# Patient Record
Sex: Female | Born: 1975
Health system: Southern US, Community
[De-identification: ages and names within clinical notes are randomized; demographics above are authoritative.]

## PROBLEM LIST (undated history)

## (undated) DIAGNOSIS — D259 Leiomyoma of uterus, unspecified: Secondary | ICD-10-CM

## (undated) DIAGNOSIS — T6111XA Scombroid fish poisoning, accidental (unintentional), initial encounter: Secondary | ICD-10-CM

## (undated) DIAGNOSIS — Z91030 Bee allergy status: Secondary | ICD-10-CM

## (undated) DIAGNOSIS — R011 Cardiac murmur, unspecified: Secondary | ICD-10-CM

## (undated) DIAGNOSIS — O139 Gestational [pregnancy-induced] hypertension without significant proteinuria, unspecified trimester: Secondary | ICD-10-CM

## (undated) DIAGNOSIS — H547 Unspecified visual loss: Secondary | ICD-10-CM

## (undated) HISTORY — DX: Gestational (pregnancy-induced) hypertension without significant proteinuria, unspecified trimester: O13.9

## (undated) HISTORY — DX: Bee allergy status: Z91.030

## (undated) HISTORY — PX: WISDOM TOOTH EXTRACTION: SHX21

## (undated) HISTORY — DX: Unspecified visual loss: H54.7

## (undated) HISTORY — DX: Leiomyoma of uterus, unspecified: D25.9

## (undated) HISTORY — DX: Scombroid fish poisoning, accidental (unintentional), initial encounter: T61.11XA

## (undated) HISTORY — DX: Cardiac murmur, unspecified: R01.1

---

## 2003-07-09 DIAGNOSIS — T6111XA Scombroid fish poisoning, accidental (unintentional), initial encounter: Secondary | ICD-10-CM

## 2003-07-09 HISTORY — DX: Scombroid fish poisoning, accidental (unintentional), initial encounter: T61.11XA

## 2003-09-08 DIAGNOSIS — R011 Cardiac murmur, unspecified: Secondary | ICD-10-CM

## 2003-09-08 HISTORY — DX: Cardiac murmur, unspecified: R01.1

## 2004-11-16 ENCOUNTER — Other Ambulatory Visit: Admission: RE | Admit: 2004-11-16 | Discharge: 2004-11-16 | Payer: Self-pay | Admitting: Obstetrics and Gynecology

## 2005-12-24 ENCOUNTER — Other Ambulatory Visit: Admission: RE | Admit: 2005-12-24 | Discharge: 2005-12-24 | Payer: Self-pay | Admitting: Obstetrics and Gynecology

## 2006-05-15 ENCOUNTER — Ambulatory Visit (HOSPITAL_COMMUNITY): Admission: RE | Admit: 2006-05-15 | Discharge: 2006-05-15 | Payer: Self-pay | Admitting: Obstetrics and Gynecology

## 2006-11-18 ENCOUNTER — Inpatient Hospital Stay (HOSPITAL_COMMUNITY): Admission: AD | Admit: 2006-11-18 | Discharge: 2006-11-18 | Payer: Self-pay | Admitting: Obstetrics and Gynecology

## 2007-04-28 ENCOUNTER — Ambulatory Visit (HOSPITAL_COMMUNITY): Admission: RE | Admit: 2007-04-28 | Discharge: 2007-04-28 | Payer: Self-pay | Admitting: Obstetrics and Gynecology

## 2007-06-30 ENCOUNTER — Inpatient Hospital Stay (HOSPITAL_COMMUNITY): Admission: AD | Admit: 2007-06-30 | Discharge: 2007-06-30 | Payer: Self-pay | Admitting: Obstetrics and Gynecology

## 2007-07-03 ENCOUNTER — Inpatient Hospital Stay (HOSPITAL_COMMUNITY): Admission: AD | Admit: 2007-07-03 | Discharge: 2007-07-03 | Payer: Self-pay | Admitting: Obstetrics & Gynecology

## 2007-07-11 ENCOUNTER — Inpatient Hospital Stay (HOSPITAL_COMMUNITY): Admission: RE | Admit: 2007-07-11 | Discharge: 2007-07-13 | Payer: Self-pay | Admitting: Obstetrics and Gynecology

## 2010-11-30 ENCOUNTER — Inpatient Hospital Stay (HOSPITAL_COMMUNITY): Admission: AD | Admit: 2010-11-30 | Payer: Self-pay | Admitting: Obstetrics and Gynecology

## 2010-12-04 ENCOUNTER — Inpatient Hospital Stay (HOSPITAL_COMMUNITY)
Admission: AD | Admit: 2010-12-04 | Discharge: 2010-12-07 | DRG: 373 | Disposition: A | Discharge status: Home / Self Care | Payer: BC Managed Care – PPO | Source: Ambulatory Visit | Attending: Obstetrics and Gynecology | Admitting: Obstetrics and Gynecology

## 2010-12-04 DIAGNOSIS — O480 Post-term pregnancy: Principal | ICD-10-CM | POA: Diagnosis present

## 2010-12-04 LAB — CBC
MCHC: 34.7 g/dL (ref 30.0–36.0)
Platelets: 237 10*3/uL (ref 150–400)
RDW: 14.2 % (ref 11.5–15.5)
WBC: 11.6 10*3/uL — ABNORMAL HIGH (ref 4.0–10.5)

## 2010-12-05 LAB — RPR: RPR Ser Ql: NONREACTIVE

## 2010-12-06 LAB — CBC
Platelets: 176 10*3/uL (ref 150–400)
RBC: 2.61 MIL/uL — ABNORMAL LOW (ref 3.87–5.11)
RDW: 14.7 % (ref 11.5–15.5)
WBC: 15.7 10*3/uL — ABNORMAL HIGH (ref 4.0–10.5)

## 2010-12-07 LAB — CBC
HCT: 21.8 % — ABNORMAL LOW (ref 36.0–46.0)
Hemoglobin: 7.2 g/dL — ABNORMAL LOW (ref 12.0–15.0)
MCHC: 33 g/dL (ref 30.0–36.0)
MCV: 92.4 fL (ref 78.0–100.0)
RDW: 15 % (ref 11.5–15.5)
WBC: 12.9 10*3/uL — ABNORMAL HIGH (ref 4.0–10.5)

## 2010-12-08 LAB — RH IMMUNE GLOB WKUP(>/=20WKS)(NOT WOMEN'S HOSP)

## 2011-02-20 NOTE — Op Note (Signed)
Laura Henderson, Laura Henderson              ACCOUNT NO.:  [id number]   MEDICAL RECORD NO.:  [id number]          PATIENT TYPE:  INP   LOCATION:  9131                          FACILITY:  [facility]   PHYSICIAN:  Juluis Mire, M.D.   DATE OF BIRTH:  24-Nov-1975   DATE OF PROCEDURE:  07/11/2007  DATE OF DISCHARGE:                               OPERATIVE REPORT   The patient is a 35 year old primigravida female brought in for  induction of labor due to [facility].  We did do artificial rupture of  membranes and Pitocin.  She advanced to complete dilatation.  After  approximately one and a half hours pushing, the vertex remained  arrested.  We found that it was OP and manually rotated it.  With this,  the fetal vertex descended further.  After another hour to hour and a  half of pushing, the fetal vertex was just beginning to crown, however,  due to maternal exhaustion, decision was to proceed with the vacuum  extractor assisted vaginal delivery.  The risks were explained including  subcutaneous hemorrhage that could require transfusion, intracranial  bleeds that could require operative intervention.  The patient was  placed in the dorsal lithotomy position.  The bladder was emptied by out  catheterization.  The fetal vertex was on the perineum.  The key was put  in place with the next contraction, we were able to bring the vertex out  to complete crowning.  The vacuum extractor was then removed.  At the  next contraction, she spontaneously delivered the vertex.  The infant  was a viable female, had Apgars of 7 and 9.  PH is pending.  There was  both nuchal and cord around the abdomen.  At this point in time,  placenta was delivered manually.  We then did identified a partial third  degree tear with just slightly tearing into the muscular capsule.  We  infiltrated the area with 1% Xylocaine.  Using 2-0 chromic, the capsule  was reapproximated with a figure-of-eight of chromic.  Next, remaining  second degree  episiotomy is repaired in usual manner with 2-0 chromic.  Rectal exam was unremarkable.  Uterus contracted down well.  Blood loss  was probably 500 mL.  The baby and mother were doing well.  The patient  did have an epidural during the labor.      Juluis Mire, M.D.  Electronically Signed     JSM/MEDQ  D:  07/11/2007  T:  07/13/2007  Job:  [id number]

## 2011-07-19 LAB — URINALYSIS, ROUTINE W REFLEX MICROSCOPIC
Glucose, UA: NEGATIVE
Leukocytes, UA: NEGATIVE
Leukocytes, UA: NEGATIVE
Nitrite: NEGATIVE
Protein, ur: NEGATIVE
Specific Gravity, Urine: <1.005 — ABNORMAL LOW
Urobilinogen, UA: 0.2
pH: 7

## 2011-07-19 LAB — COMPREHENSIVE METABOLIC PANEL
ALT: 14
AST: 17
Alkaline Phosphatase: 93
CO2: 24
CO2: 24
Calcium: 8.7
Calcium: 9.1
Chloride: 108
Creatinine, Ser: 0.62
GFR calc Af Amer: >60
GFR calc Af Amer: >60
GFR calc non Af Amer: >60
GFR calc non Af Amer: >60
Glucose, Bld: 105 — ABNORMAL HIGH
Glucose, Bld: 95
Potassium: 3.3 — ABNORMAL LOW
Sodium: 137
Total Protein: 5.9 — ABNORMAL LOW

## 2011-07-19 LAB — CBC
HCT: 26.4 — ABNORMAL LOW
Hemoglobin: 12
Hemoglobin: 11.6 — ABNORMAL LOW
MCHC: 35
MCHC: 35.3
MCHC: 36
MCV: 91.7
MCV: 92.6
MCV: 91.6
Platelets: 236
RBC: 3.73 — ABNORMAL LOW
RBC: 3.55 — ABNORMAL LOW
RBC: 3.64 — ABNORMAL LOW
RDW: 13
RDW: 13.4
RDW: 13
WBC: 21.7 — ABNORMAL HIGH
WBC: 12.4 — ABNORMAL HIGH

## 2011-07-19 LAB — URINE MICROSCOPIC-ADD ON

## 2011-07-19 LAB — LACTATE DEHYDROGENASE: LDH: 158

## 2011-07-19 LAB — RH IMMUNE GLOB WKUP(>/=20WKS)(NOT WOMEN'S HOSP)

## 2011-07-23 LAB — RH IMMUNE GLOBULIN WORKUP (NOT WOMEN'S HOSP)

## 2012-02-05 ENCOUNTER — Encounter: Payer: Self-pay | Admitting: Medical

## 2012-02-05 ENCOUNTER — Ambulatory Visit (INDEPENDENT_AMBULATORY_CARE_PROVIDER_SITE_OTHER): Payer: BC Managed Care – PPO | Admitting: Medical

## 2012-02-05 VITALS — BP 110/70 | HR 70 | Temp 98.4°F | Wt 146.0 lb

## 2012-02-05 DIAGNOSIS — J029 Acute pharyngitis, unspecified: Secondary | ICD-10-CM

## 2012-02-05 NOTE — Progress Notes (Signed)
Subjective:  Laura Henderson is a 36 y.o. female who presents as a new patient today.  Former patient of Dr. Delford Field at [facility].  She is here for 1 wk hx/o sore throat.  [facility], has ulcer in back of throat and ear infection along with sore throat, prescribed Amoxicillin due to sore throat, and she is on day 7 of this.  She notes ongoing sore throat pain, some ear pressure, post nasal drainage, mild cough.   Only sick contacts is her 36yo with runny nose.  Otherwise feels ok.  No strep contacts.  Using Tylenol.  No other aggravating or relieving factors.    Past Medical History  Diagnosis Date  . Vision problem     wears contacts and glasses   ROS Gen: no fever, chills HEENT: no sinus pressure, headache Lungs: no SOB, wheezing GI: no NVD, no hx/o GERD or heartburn    Objective:      Filed Vitals:   02/05/12 1535  BP: 110/70  Pulse: 70  Temp: 98.4 F (36.9 C)    General appearance: no distress, WD/WN HEENT: normocephalic, conjunctiva/corneas normal, sclerae anicteric, nares patent, no discharge or erythema, pharynx with mild to moderate erythema, +post nasal drip, no exudate.  Oral cavity: MMM, no lesions  Neck: supple, shoddy anterior lymphadenopathy, no thyromegaly Heart: RRR, normal S1, S2, no murmurs Lungs: CTA bilaterally, no wheezes, rhonchi, or rales  Laboratory Strep test done. Results:negative.    Assessment and Plan:   Encounter Diagnosis  Name Primary?  . Sore throat Yes    Advised that symptoms and exam suggest a viral etiology vs some post nasal drip likely due to pollen.  Discussed symptomatic treatment including salt water gargles, warm fluids, rest, hydrate well, can use over-the-counter Tylenol for throat pain, fever, or malaise.   Finish amoxicillin and consider Zyrtec 10mg  QHS OTC for a few weeks.  If worse or not improving within 2-3 days, call or return.

## 2012-07-30 ENCOUNTER — Encounter: Payer: Self-pay | Admitting: Internal Medicine

## 2012-10-23 ENCOUNTER — Encounter: Payer: Self-pay | Admitting: Family Medicine

## 2012-10-23 ENCOUNTER — Ambulatory Visit (INDEPENDENT_AMBULATORY_CARE_PROVIDER_SITE_OTHER): Payer: BC Managed Care – PPO | Admitting: Family Medicine

## 2012-10-23 VITALS — BP 118/70 | HR 68 | Ht 67.5 in | Wt 145.0 lb

## 2012-10-23 DIAGNOSIS — Z13220 Encounter for screening for lipoid disorders: Secondary | ICD-10-CM

## 2012-10-23 DIAGNOSIS — Z0000 Encounter for general adult medical examination without abnormal findings: Secondary | ICD-10-CM

## 2012-10-23 DIAGNOSIS — R5381 Other malaise: Secondary | ICD-10-CM

## 2012-10-23 LAB — POCT URINALYSIS DIPSTICK
Ketones, UA: NEGATIVE
Leukocytes, UA: NEGATIVE
Protein, UA: NEGATIVE
Spec Grav, UA: <=1.005
pH, UA: 6

## 2012-10-23 NOTE — Patient Instructions (Addendum)

## 2012-10-23 NOTE — Progress Notes (Signed)
Chief Complaint  Patient presents with  . Annual Exam    fasting annual exam no pap sees Dr Renaldo Fiddler at [facility] and is currently UTD. No major concerns.   Laura Henderson is a 37 y.o. female who presents for a complete physical.  She has no specific conerns.   Immunization History  Administered Date(s) Administered  . Influenza Split 07/29/2012  . Tdap 05/21/2005   Last Pap smear: 2013 Last mammogram: never Last colonoscopy: never Last DEXA: never Dentist: twice yearly Ophtho: yearly Exercise: 3x/week at gym; swimming, running, classes  Past Medical History  Diagnosis Date  . Vision problem     wears contacts and glasses  . 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 Bee sting allergy   . Heart murmur 12/04    echo mild TR, trace MR  . PIH (pregnancy induced hypertension)     end of first pregnancy    Past Surgical History  Procedure Date  . Wisdom tooth extraction     History   Social History  . Marital Status: Married    Spouse Name: N/A    Number of Children: N/A  . Years of Education: N/A   Occupational History  . homemaker    Social History Main Topics  . Smoking status: Never Smoker   . Smokeless tobacco: Never Used  . Alcohol Use: 0.6 oz/week    1 Cans of beer per week     Comment: 2-3 drinks per week, wine.  . Drug Use: No  . Sexually Active: Yes -- Female partner(s)    Birth Control/ Protection: IUD   Other Topics Concern  . Not on file   Social History Narrative   Married, 2 daughters, 2 dogs    Family History  Problem Relation Age of Onset  . Hypertension Mother   . Diabetes Father     pre-diabetes  . Muscular dystrophy Maternal Aunt   . Cancer Paternal Grandmother 58    breast  . Breast cancer Paternal Grandmother   . Diabetes Paternal Grandfather   . Heart disease Neg Hx   . Stroke Neg Hx   . Diabetes Other     Current outpatient prescriptions:levonorgestrel (MIRENA) 20 MCG/24HR IUD, 1 each by Intrauterine route once., Disp:  , Rfl: ;  Multiple Vitamins-Minerals (MULTIVITAMIN WITH MINERALS) tablet, Take 1 tablet by mouth daily., Disp: , Rfl:   Allergies  Allergen Reactions  . Sulfa Drugs Cross Reactors Hives   ROS:  The patient denies anorexia, fever, weight changes, headaches,  vision changes, decreased hearing, ear pain, sore throat, breast concerns, chest pain, palpitations, dizziness, syncope, dyspnea on exertion, cough, swelling, nausea, vomiting, diarrhea, constipation, abdominal pain, melena, hematochezia, indigestion/heartburn, hematuria, incontinence, dysuria, no regular menses, just occasional spotting (IUD), vaginal discharge, odor or itch, genital lesions, joint pains, numbness, tingling, weakness, tremor, suspicious skin lesions (sees dermatologist yearly), depression, anxiety, abnormal bleeding/bruising, or enlarged lymph nodes. Donates blood regularly and Hg is fine.  PHYSICAL EXAM: BP 118/70  Pulse 68  Ht 5' 7.5" (1.715 m)  Wt 145 lb (65.772 kg)  BMI 22.38 kg/m2  General Appearance:    Alert, cooperative, no distress, appears stated age  Head:    Normocephalic, without obvious abnormality, atraumatic  Eyes:    PERRL, conjunctiva/corneas clear, EOM's intact, fundi    benign  Ears:    Normal TM's and external ear canals  Nose:   Nares normal, mucosa normal, no drainage or sinus   tenderness  Throat:   Lips, mucosa, and  tongue normal; teeth and gums normal  Neck:   Supple, no lymphadenopathy;  thyroid:  no   enlargement/tenderness/nodules; no carotid   bruit or JVD  Back:    Spine nontender, no curvature, ROM normal, no CVA     tenderness  Lungs:     Clear to auscultation bilaterally without wheezes, rales or     ronchi; respirations unlabored  Chest Wall:    No tenderness or deformity   Heart:    Regular rate and rhythm, S1 and S2 normal, no murmur, rub   or gallop  Breast Exam:    Deferred to GYN  Abdomen:     Soft, non-tender, nondistended, normoactive bowel sounds,    no masses, no  hepatosplenomegaly  Genitalia:    Deferred to GYN     Extremities:   No clubbing, cyanosis or edema  Pulses:   2+ and symmetric all extremities  Skin:   Skin color, texture, turgor normal, no rashes or lesions  Lymph nodes:   Cervical, supraclavicular, and axillary nodes normal  Neurologic:   CNII-XII intact, normal strength, sensation and gait; reflexes 2+ and symmetric throughout          Psych:   Normal mood, affect, hygiene and grooming.    ASSESSMENT/PLAN:  1. Routine general medical examination at a health care facility  Visual acuity screening, POCT Urinalysis Dipstick, Lipid panel, Comprehensive metabolic panel, Vitamin D 25 hydroxy, TSH  2. Screening for lipoid disorders  Lipid panel  3. Other malaise and fatigue  Comprehensive metabolic panel, Vitamin D 25 hydroxy, TSH    Discussed monthly self breast exams and yearly mammograms after the age of 33; at least 30 minutes of aerobic activity at least 5 days/week; proper sunscreen use reviewed; healthy diet, including goals of calcium and vitamin D intake and alcohol recommendations (less than or equal to 1 drink/day) reviewed; regular seatbelt use; changing batteries in smoke detectors.  Immunization recommendations discussed.  Colonoscopy recommendations reviewed

## 2012-10-24 ENCOUNTER — Encounter: Payer: Self-pay | Admitting: Family Medicine

## 2012-10-24 LAB — COMPREHENSIVE METABOLIC PANEL
AST: 12 U/L (ref 0–37)
Albumin: 4.7 g/dL (ref 3.5–5.2)
Alkaline Phosphatase: 43 U/L (ref 39–117)
Potassium: 4 mEq/L (ref 3.5–5.3)
Sodium: 138 mEq/L (ref 135–145)
Total Protein: 6.9 g/dL (ref 6.0–8.3)

## 2012-10-24 LAB — LIPID PANEL
Total CHOL/HDL Ratio: 2.4 Ratio
VLDL: 8 mg/dL (ref 0–40)

## 2013-01-21 ENCOUNTER — Other Ambulatory Visit: Payer: BC Managed Care – PPO

## 2013-01-21 DIAGNOSIS — R799 Abnormal finding of blood chemistry, unspecified: Secondary | ICD-10-CM

## 2013-01-21 LAB — HEPATIC FUNCTION PANEL
ALT: 10 U/L (ref 0–35)
Alkaline Phosphatase: 44 U/L (ref 39–117)
Indirect Bilirubin: 1.5 mg/dL — ABNORMAL HIGH (ref 0.0–0.9)
Total Protein: 6.9 g/dL (ref 6.0–8.3)

## 2013-01-22 ENCOUNTER — Encounter: Payer: Self-pay | Admitting: Family Medicine

## 2013-04-13 ENCOUNTER — Telehealth: Payer: Self-pay | Admitting: Internal Medicine

## 2013-04-13 MED ORDER — EPINEPHRINE 0.3 MG/0.3ML IJ SOAJ: 0.3000 mg | Freq: Once | INTRAMUSCULAR | Status: DC

## 2013-04-13 NOTE — Telephone Encounter (Signed)
Patient advised of Dr.Knapp's recommendations and epi pen was called in.

## 2013-04-13 NOTE — Telephone Encounter (Signed)
Advise pt--Epi-pens are only to be used for anaphylactic reactions (NOT tick bites that get infected, etc).  And always require an ER visit if used (due to possible side effects of the med, and recurrence of allergic reaction, requiring additional treatment).  Okay for epi-pen

## 2013-04-13 NOTE — Telephone Encounter (Signed)
Pt states she would like to carry an epipen with her if you will prescribe her one, cause she is qllergic to spider bites,and tick bites and then in the past has been allergic to bee stings and want to carry it for protection. Send to [facility] [address]

## 2013-07-27 ENCOUNTER — Ambulatory Visit (INDEPENDENT_AMBULATORY_CARE_PROVIDER_SITE_OTHER): Payer: Managed Care, Other (non HMO) | Admitting: Family Medicine

## 2013-07-27 ENCOUNTER — Encounter: Payer: Self-pay | Admitting: Family Medicine

## 2013-07-27 VITALS — BP 130/80 | HR 76 | Temp 98.7°F | Ht 68.0 in | Wt 140.0 lb

## 2013-07-27 DIAGNOSIS — Z23 Encounter for immunization: Secondary | ICD-10-CM

## 2013-07-27 DIAGNOSIS — J069 Acute upper respiratory infection, unspecified: Secondary | ICD-10-CM

## 2013-07-27 NOTE — Progress Notes (Signed)
Chief Complaint  Patient presents with  . cold    cold since last week. one eye last night was red and crusty., constant cough,, running nose with yellowish mucous, drainage, flu shot given today   She was sick a month ago, it got better for a week or two, and then it recurred.  One of her daughter's was sick about 1-2 weeks ago.  She got better, but her symptoms have progressively gotten worse.  She is having a lot of postnasal drainage and cough.  She initially had sore throat, starting 5 ays ago, which has improved, but cough has gotten worse.  Phlegm is yellow and thick.  Similar mucus from the nose.  She is having sinus pressure under her eyes.  Yesterday morning she woke up with crusting of her nose and eyes, and last night she noticed that her right eye was red.  She has mild itching.  Eye is less red today. She is having some mild vertigo after leaning forward to pick up toys. No vertigo with other positions.  She has been using tylenol cold and sinus during the day, with some improvement.  She used a combination theraflu last night with decongestant cough suppressant--worked well until it wore off in the middle of the night.,  Past Medical History  Diagnosis Date  . Vision problem     wears contacts and glasses  . 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 Bee sting allergy   . Heart murmur 12/04    echo mild TR, trace MR  . PIH (pregnancy induced hypertension)     end of first pregnancy   Past Surgical History  Procedure Laterality Date  . Wisdom tooth extraction     History   Social History  . Marital Status: Married    Spouse Name: N/A    Number of Children: N/A  . Years of Education: N/A   Occupational History  . homemaker    Social History Main Topics  . Smoking status: Never Smoker   . Smokeless tobacco: Never Used  . Alcohol Use: 0.6 oz/week    1 Cans of beer per week     Comment: 2-3 drinks per week, wine.  . Drug Use: No  . Sexual Activity: Yes    Partners: Male    Birth Control/ Protection: IUD   Other Topics Concern  . Not on file   Social History Narrative   Married, 2 daughters, 2 dogs   Current outpatient prescriptions:EPINEPHrine (EPI-PEN) 0.3 mg/0.3 mL SOAJ, Inject 0.3 mLs (0.3 mg total) into the muscle once., Disp: 1 Device, Rfl: 0;  levonorgestrel (MIRENA) 20 MCG/24HR IUD, 1 each by Intrauterine route once., Disp: , Rfl: ;  Multiple Vitamins-Minerals (MULTIVITAMIN WITH MINERALS) tablet, Take 1 tablet by mouth daily., Disp: , Rfl: ;  VITAMIN D, ERGOCALCIFEROL, PO, Take by mouth., Disp: , Rfl:   Allergies  Allergen Reactions  . Sulfa Drugs Cross Reactors Hives   ROS: Denies fevers, nausea, vomiting, diarrhea, skin rashes, ear pain.  No bleeding/bruising. See HPI.  No chest pain, palpitations, edema or other concerns.  PHYSICAL EXAM: BP 130/80  Pulse 76  Temp(Src) 98.7 F (37.1 C) (Oral)  Ht 5\' 8"  (1.727 m)  Wt 140 lb (63.504 kg)  BMI 21.29 kg/m2 Well developed, pleasant female in no distress HEENT:  PERRL, EOMI.  Mild conjunctival injection of right eye. No crusting or purulence, no soft tissue swelling Nose--mild edema, mild erythema. Sinuses nontender. OP clear without erythema Neck: no lymphadenopathy or mass  Heart: regular rate and rhythm without murmur Lungs: clear bilaterally Skin: no rash Neuro: alert and oriented, cranial nerves intact  ASSESSMENT/PLAN:  Acute upper respiratory infections of unspecified site  Need for prophylactic vaccination and inoculation against influenza - Plan: Flu Vaccine QUAD 36+ mos IM  Supportive measures reviewed. Call later this week for antibiotic if symptoms persist/worsen rather than continue to improve (as would be the case if viral).

## 2013-07-27 NOTE — Patient Instructions (Signed)
Drink plenty of fluids. Continue decongestants (ie I recommend a 12-hour sudafed to be taken in the morning) Continue expectorant (I recommend Mucinex--either plain, along with Delsym syrup as needed for cough, or you get get the combination  Mucinex DM which also contains the cough suppressant).  Call us if your symptoms persist and/or worsen over the next week for antibiotics for a sinus infection (it appears yours is viral now). No evidence of bacterial conjunctivitis--your eye symptoms are likely viral also.  Avoid contact use.  Use eye drops as needed. Consider antihistamine eye drops if you have a lot of itching  Frequent hand washing

## 2013-08-03 ENCOUNTER — Telehealth: Payer: Self-pay | Admitting: Family Medicine

## 2013-08-03 DIAGNOSIS — J069 Acute upper respiratory infection, unspecified: Secondary | ICD-10-CM

## 2013-08-03 MED ORDER — AZITHROMYCIN 250 MG PO TABS: ORAL_TABLET | ORAL | Status: DC

## 2013-08-03 NOTE — Telephone Encounter (Signed)
Advise pt--zpak was sent to her pharmacy

## 2013-08-03 NOTE — Telephone Encounter (Signed)
Called to advise pt that Z pak sent to pharmacy

## 2013-08-07 DIAGNOSIS — Z0279 Encounter for issue of other medical certificate: Secondary | ICD-10-CM

## 2013-08-28 ENCOUNTER — Telehealth: Payer: Self-pay | Admitting: Internal Medicine

## 2013-08-28 NOTE — Telephone Encounter (Signed)
Faxed over medical records to [facility] @ [phone] on 08/07/13

## 2016-01-17 DIAGNOSIS — H61001 Unspecified perichondritis of right external ear: Secondary | ICD-10-CM | POA: Diagnosis not present

## 2016-01-17 DIAGNOSIS — L570 Actinic keratosis: Secondary | ICD-10-CM | POA: Diagnosis not present

## 2016-02-20 DIAGNOSIS — Z30433 Encounter for removal and reinsertion of intrauterine contraceptive device: Secondary | ICD-10-CM | POA: Diagnosis not present

## 2016-02-20 DIAGNOSIS — Z1231 Encounter for screening mammogram for malignant neoplasm of breast: Secondary | ICD-10-CM | POA: Diagnosis not present

## 2016-05-07 ENCOUNTER — Encounter (HOSPITAL_COMMUNITY): Payer: Self-pay | Admitting: Emergency Medicine

## 2016-05-07 ENCOUNTER — Emergency Department (HOSPITAL_COMMUNITY)
Admission: EM | Admit: 2016-05-07 | Discharge: 2016-05-07 | Disposition: A | Discharge status: Home / Self Care | Payer: Self-pay | Attending: Emergency Medicine | Admitting: Emergency Medicine

## 2016-05-07 DIAGNOSIS — T7840XA Allergy, unspecified, initial encounter: Secondary | ICD-10-CM | POA: Insufficient documentation

## 2016-05-07 MED ORDER — PREDNISONE 20 MG PO TABS: 40.0000 mg | ORAL_TABLET | Freq: Every day | ORAL | 0 refills | Status: DC

## 2016-05-07 MED ORDER — EPINEPHRINE 0.3 MG/0.3ML IJ SOAJ: 0.3000 mg | Freq: Once | INTRAMUSCULAR | 2 refills | Status: AC

## 2016-05-07 MED ORDER — PREDNISONE 20 MG PO TABS
60.0000 mg | ORAL_TABLET | ORAL | Status: AC
  Administered 2016-05-07: 60 mg via ORAL
  Filled 2016-05-07: qty 3

## 2016-05-07 NOTE — Discharge Instructions (Signed)
For the next 2 days please use Benadryl, 25 mg, 3 times daily in addition to Pepcid, 20 mg, twice daily.  Please monitor your condition carefully, return here for concerning changes in your condition.

## 2016-05-07 NOTE — ED Notes (Signed)
Pt agreeable to discharge, esignature not working at this time. Pt received D/C instructions and prescriptions.

## 2016-05-07 NOTE — ED Provider Notes (Signed)
[facility]-EMERGENCY DEPT Provider Note   CSN: [id number] Arrival date & time: 05/07/16  1224   History   Chief Complaint Chief Complaint  Patient presents with  . Allergic Reaction    HPI Laura Henderson is a 40 y.o. female.  HPI  Patient presents with concern of persistent swelling, discomfort, redness about an area on her left medial distal calf. Patient goals being stung 2 days ago. Since that time she has had persistent discomfort in the area, has been taking Benadryl, several times daily. However, the patient today had a change in condition, with sudden onset of generalized discomfort, lightheadedness, lip swelling. No additional precipitant. 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 Currently the patient states that she feels slightly better, has persistent pain in the distal leg, but no dyspnea, no throat swelling, no lightheadedness, no chest pain. Patient acknowledges a history of allergy to bee sting.   Past Medical History:  Diagnosis Date  . Bee sting allergy   . Heart murmur 12/04   echo mild TR, trace MR  . PIH (pregnancy induced hypertension)    end of first pregnancy  . 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 Vision problem    wears contacts and glasses    There are no active problems to display for this patient.   Past Surgical History:  Procedure Laterality Date  . WISDOM TOOTH EXTRACTION      OB History    No data available       Home Medications    Prior to Admission medications   Medication Sig Start Date End Date Taking? Authorizing Provider  EPINEPHrine 0.3 mg/0.3 mL IJ SOAJ injection Inject 0.3 mLs (0.3 mg total) into the muscle once. 05/07/16 05/07/16  Gerhard Munch, MD  levonorgestrel (MIRENA) 20 MCG/24HR IUD 1 each by Intrauterine route once. 02/06/11   Historical Provider, MD  Multiple Vitamins-Minerals (MULTIVITAMIN WITH MINERALS)  tablet Take 1 tablet by mouth daily.    Historical Provider, MD  predniSONE (DELTASONE) 20 MG tablet Take 2 tablets (40 mg total) by mouth daily with breakfast. For the next four days 05/07/16   Gerhard Munch, MD  VITAMIN D, ERGOCALCIFEROL, PO Take by mouth.    Historical Provider, MD    Family History Family History  Problem Relation Age of Onset  . Hypertension Mother   . Diabetes Father     pre-diabetes  . Diabetes Other   . Muscular dystrophy Maternal Aunt   . Cancer Paternal Grandmother 33    breast  . Breast cancer Paternal Grandmother   . Diabetes Paternal Grandfather   . Heart disease Neg Hx   . Stroke Neg Hx     Social History Social History  Substance Use Topics  . Smoking status: Never Smoker  . Smokeless tobacco: Never Used  . Alcohol use 0.6 oz/week    1 Cans of beer per week     Comment: 2-3 drinks per week, wine.     Allergies   Bee venom and Sulfa drugs cross reactors   Review of Systems Review of Systems  Constitutional: Negative for fever.  Respiratory: Negative for shortness of breath.   Cardiovascular: Negative for chest pain.  Musculoskeletal:       Negative aside from HPI  Skin: Positive for color change and wound.       Negative aside from HPI  Allergic/Immunologic: Negative for immunocompromised state.  Neurological: Negative for weakness.  Physical Exam Updated Vital Signs BP 133/75 (BP Location: Left Arm)   Pulse 93   Temp 97.7 F (36.5 C) (Oral)   Resp 16   Ht 5\' 8"  (1.727 m)   Wt 150 lb (68 kg)   SpO2 100%   BMI 22.81 kg/m   Physical Exam  Constitutional: She is oriented to person, place, and time. She appears well-developed and well-nourished. No distress.  HENT:  Head: Normocephalic and atraumatic.  Eyes: Conjunctivae and EOM are normal.  Pulmonary/Chest: Effort normal. No stridor. No respiratory distress.  Abdominal: She exhibits no distension.  Musculoskeletal: She exhibits no edema.  Neurological: She is alert  and oriented to person, place, and time. No cranial nerve deficit.  Skin: Skin is warm and dry.     Psychiatric: She has a normal mood and affect.  Nursing note and vitals reviewed.    Procedures Procedures (including critical care time)  Medications Ordered in ED Medications  predniSONE (DELTASONE) tablet 60 mg (not administered)     Initial Impression / Assessment and Plan / ED Course  I have reviewed the triage vital signs and the nursing notes.  Pertinent labs & imaging results that were available during my care of the patient were reviewed by me and considered in my medical decision making (see chart for details).  Clinical Course   Patient presents after suffering likely bee sting 36 hours ago.  Patient has evidence for local inflammatory reaction, but no evidence for anaphylaxis. Patient has already begun taking antihistamine, received steroids, additional instructions on dual antihistamine therapy, close return precautions. Patient also received a prescription for EpiPen. With no evidence for anaphylaxis, or ongoing cellulitis, patient discharged in stable condition.   Final Clinical Impressions(s) / ED Diagnoses   Final diagnoses:  Allergic reaction, initial encounter    New Prescriptions New Prescriptions   PREDNISONE (DELTASONE) 20 MG TABLET    Take 2 tablets (40 mg total) by mouth daily with breakfast. For the next four days     Gerhard Munch, MD 05/07/16 [phone]

## 2016-05-07 NOTE — ED Triage Notes (Signed)
Pt was stung by a wasp on left ankle on Saturday-- has been taking benadryl every 4 hours, took 50 mg at 12 today, had an episode of dizziness, become pale, sweaty, today -- thinks it is from sting. Has a hx of allergic rxn to bees, Laura Henderson -- has an epi pen for fish rxn.

## 2016-08-08 DIAGNOSIS — D2239 Melanocytic nevi of other parts of face: Secondary | ICD-10-CM | POA: Diagnosis not present

## 2016-08-08 DIAGNOSIS — L718 Other rosacea: Secondary | ICD-10-CM | POA: Diagnosis not present

## 2016-08-08 DIAGNOSIS — D224 Melanocytic nevi of scalp and neck: Secondary | ICD-10-CM | POA: Diagnosis not present

## 2016-08-08 DIAGNOSIS — D2261 Melanocytic nevi of right upper limb, including shoulder: Secondary | ICD-10-CM | POA: Diagnosis not present

## 2016-11-01 DIAGNOSIS — Z01419 Encounter for gynecological examination (general) (routine) without abnormal findings: Secondary | ICD-10-CM | POA: Diagnosis not present

## 2016-11-01 DIAGNOSIS — Z6824 Body mass index (BMI) 24.0-24.9, adult: Secondary | ICD-10-CM | POA: Diagnosis not present

## 2017-07-31 NOTE — Progress Notes (Signed)
Chief Complaint  Patient presents with  . Annual Exam    nonfasting annual exam, no pap-sees Dr Vickey Sages and is UTD. Did not do eye exam, she is UTD and sees Dr Diana Eves. No concerns.     Laura Henderson is a 41 y.o. female who presents for a complete physical.  She has the following concerns:  Mother was diagnosed about 2 years ago (age 66) with limb girdle muscular dystrophy.  She had some trouble with stairs with a heavy load for about 20 years, was kind of "written off" for years.  Her maternal aunt has a more severe form of muscular dystrophy.  She brings in paperwork from genetic eval that her sister had in 2016.  It reports that their mother has Limb Girdle Muscular Dystrophy type 2I (LGMD2I).  Mother and maternal aunt are homozygous for [id number] Info states that heterozygous carriers have a risk for cardiomyopathy (even if no muscular symptoms) and should be screened with EKG and echocardiogram every 3-5 years or with changes in symptoms (palpitations, fainting, dyspnea).   She actually has had echo in the past--09/2003, showing mild TR, trace MR.  She denies any cardiac symptoms.  Vitamin D deficiency:  Level was low at 24 in 10/2012. She doesn't currently take a regular supplement, just a MVI a couple of times/week.   Immunization History  Administered Date(s) Administered  . Influenza Split 07/29/2012  . Influenza,inj,Quad PF,6+ Mos 07/27/2013  . Tdap 05/21/2005   Last Pap smear: regularly through Dr. Renaldo Fiddler Last mammogram: age 10 Last colonoscopy: never Last DEXA: never Dentist: twice yearly Ophtho: yearly Exercise: 3-4x/week biking, walking, hiking with dog, yoga, gym, swimming Lipid screen: Lab Results  Component Value Date   CHOL 132 10/23/2012   HDL 55 10/23/2012   LDLCALC 69 10/23/2012   TRIG 39 10/23/2012   CHOLHDL 2.4 10/23/2012   Past Medical History:  Diagnosis Date  . Bee sting allergy   . Heart murmur 12/04   echo mild TR, trace MR  . PIH (pregnancy  induced hypertension)    end of first pregnancy  . 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 Vision problem    wears contacts and glasses    Past Surgical History:  Procedure Laterality Date  . WISDOM TOOTH EXTRACTION      Social History   Social History  . Marital status: Married    Spouse name: N/A  . Number of children: N/A  . Years of education: N/A   Occupational History  . homemaker    Social History Main Topics  . Smoking status: Never Smoker  . Smokeless tobacco: Never Used  . Alcohol use 0.6 oz/week    1 Cans of beer per week     Comment:  3-4 drinks per week, wine.  . Drug use: No  . Sexual activity: Yes    Partners: Male    Birth control/ protection: IUD     Comment: Mirena   Other Topics Concern  . Not on file   Social History Narrative   Married, 2 daughters, 1 dog    Family History  Problem Relation Age of Onset  . Hypertension Mother   . Muscular dystrophy Mother        Limb Girdle MD type 2I  . Kidney Stones Mother   . Diabetes Father        pre-diabetes  . Diabetes Other   . Muscular dystrophy Maternal Aunt   . Cancer Paternal Grandmother 29  breast  . Breast cancer Paternal Grandmother   . Diabetes Paternal Grandfather   . Heart disease Neg Hx   . Stroke Neg Hx     Outpatient Encounter Prescriptions as of 08/01/2017  Medication Sig Note  . levonorgestrel (MIRENA) 20 MCG/24HR IUD 1 each by Intrauterine route once.   . Multiple Vitamins-Minerals (MULTIVITAMIN WITH MINERALS) tablet Take 1 tablet by mouth daily. 08/01/2017: Maybe twice weekly   . [DISCONTINUED] predniSONE (DELTASONE) 20 MG tablet Take 2 tablets (40 mg total) by mouth daily with breakfast. For the next four days   . [DISCONTINUED] VITAMIN D, ERGOCALCIFEROL, PO Take by mouth.    No facility-administered encounter medications on file as of 08/01/2017.     Allergies  Allergen Reactions  . Bee Venom Anaphylaxis  . Sulfa Drugs Cross Reactors Hives    ROS:  The patient  denies anorexia, fever, headaches, vision changes, decreased hearing, ear pain, sore throat, breast concerns, chest pain, palpitations, dizziness, syncope, dyspnea on exertion, cough, swelling, nausea, vomiting, diarrhea, constipation, abdominal pain, melena, hematochezia, indigestion/heartburn, hematuria, incontinence, dysuria, no regular menses, just occasional spotting (IUD), vaginal discharge, odor or itch, genital lesions, joint pains, numbness, tingling, weakness, tremor, suspicious skin lesions (sees dermatologist yearly), depression, anxiety, abnormal bleeding/bruising, or enlarged lymph nodes. Donates blood regularly.  Some weight gain. She denies significant change in clothes size.   PHYSICAL EXAM:  BP 126/76   Pulse 68   Ht 5\' 8"  (1.727 m)   Wt 161 lb (73 kg)   BMI 24.48 kg/m   Wt Readings from Last 3 Encounters:  08/01/17 161 lb (73 kg)  05/07/16 150 lb (68 kg)  07/27/13 140 lb (63.5 kg)     General Appearance:    Alert, cooperative, no distress, appears stated age  Head:    Normocephalic, without obvious abnormality, atraumatic  Eyes:    PERRL, conjunctiva/corneas clear, EOM's intact, fundi benign  Ears:    Normal TM's and external ear canals  Nose:   Nares normal, mucosa normal, no drainage or sinus tenderness  Throat:   Lips, mucosa, and tongue normal; teeth and gums normal  Neck:   Supple, no lymphadenopathy;  thyroid: no enlargement/tenderness/nodules; no carotid bruit or JVD  Back:    Spine nontender, no curvature, ROM normal, no CVA tenderness  Lungs:     Clear to auscultation bilaterally without wheezes, rales or ronchi; respirations unlabored  Chest Wall:    No tenderness or deformity   Heart:    Regular rate and rhythm, S1 and S2 normal, no murmur, rub or gallop  Breast Exam:    Deferred to GYN  Abdomen:     Soft, non-tender, nondistended, normoactive bowel sounds, no masses, no hepatosplenomegaly  Genitalia:    Deferred to GYN     Extremities:   No  clubbing, cyanosis or edema  Pulses:   2+ and symmetric all extremities  Skin:   Skin color, texture, turgor normal, no rashes or lesions  Lymph nodes:   Cervical, supraclavicular, and axillary nodes normal  Neurologic:   CNII-XII intact, normal strength, sensation and gait; reflexes 2+ and symmetric throughout                                Psych:   Normal mood, affect, hygiene and grooming.    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  Immunization recommendations discussed--Td and flu shot today.  Colonoscopy recommendations reviewed, age 87.

## 2017-08-01 ENCOUNTER — Encounter: Payer: Self-pay | Admitting: Family Medicine

## 2017-08-01 ENCOUNTER — Ambulatory Visit (INDEPENDENT_AMBULATORY_CARE_PROVIDER_SITE_OTHER): Payer: BLUE CROSS/BLUE SHIELD | Admitting: Family Medicine

## 2017-08-01 VITALS — BP 126/76 | HR 68 | Ht 68.0 in | Wt 161.0 lb

## 2017-08-01 DIAGNOSIS — Z0000 Encounter for general adult medical examination without abnormal findings: Secondary | ICD-10-CM | POA: Diagnosis not present

## 2017-08-01 DIAGNOSIS — Z148 Genetic carrier of other disease: Secondary | ICD-10-CM | POA: Diagnosis not present

## 2017-08-01 DIAGNOSIS — E559 Vitamin D deficiency, unspecified: Secondary | ICD-10-CM

## 2017-08-01 DIAGNOSIS — Z9189 Other specified personal risk factors, not elsewhere classified: Secondary | ICD-10-CM

## 2017-08-01 DIAGNOSIS — Z23 Encounter for immunization: Secondary | ICD-10-CM | POA: Diagnosis not present

## 2017-08-01 LAB — POCT URINALYSIS DIP (PROADVANTAGE DEVICE)
BILIRUBIN UA: NEGATIVE mg/dL
Bilirubin, UA: NEGATIVE
Glucose, UA: NEGATIVE mg/dL
Leukocytes, UA: NEGATIVE
Nitrite, UA: NEGATIVE
PROTEIN UA: NEGATIVE mg/dL
RBC UA: NEGATIVE
SPECIFIC GRAVITY, URINE: 1.01
UUROB: NEGATIVE
pH, UA: 7.5 (ref 5.0–8.0)

## 2017-08-01 NOTE — Patient Instructions (Signed)

## 2017-08-02 ENCOUNTER — Other Ambulatory Visit: Payer: BLUE CROSS/BLUE SHIELD

## 2017-08-02 DIAGNOSIS — Z0000 Encounter for general adult medical examination without abnormal findings: Secondary | ICD-10-CM

## 2017-08-02 DIAGNOSIS — E559 Vitamin D deficiency, unspecified: Secondary | ICD-10-CM

## 2017-08-03 LAB — COMPREHENSIVE METABOLIC PANEL
AG RATIO: 1.6 (calc) (ref 1.0–2.5)
ALT: 10 U/L (ref 6–29)
AST: 13 U/L (ref 10–30)
Albumin: 4.1 g/dL (ref 3.6–5.1)
Alkaline phosphatase (APISO): 47 U/L (ref 33–115)
BUN: 14 mg/dL (ref 7–25)
CO2: 23 mmol/L (ref 20–32)
Calcium: 8.8 mg/dL (ref 8.6–10.2)
Chloride: 105 mmol/L (ref 98–110)
Creat: 0.78 mg/dL (ref 0.50–1.10)
GLOBULIN: 2.5 g/dL (ref 1.9–3.7)
GLUCOSE: 87 mg/dL (ref 65–99)
Potassium: 4 mmol/L (ref 3.5–5.3)
SODIUM: 137 mmol/L (ref 135–146)
TOTAL PROTEIN: 6.6 g/dL (ref 6.1–8.1)
Total Bilirubin: 1.8 mg/dL — ABNORMAL HIGH (ref 0.2–1.2)

## 2017-08-03 LAB — CBC WITH DIFFERENTIAL/PLATELET
BASOS PCT: 0.5 %
Basophils Absolute: 30 cells/uL (ref 0–200)
EOS PCT: 3.6 %
Eosinophils Absolute: 212 cells/uL (ref 15–500)
HCT: 37.5 % (ref 35.0–45.0)
Hemoglobin: 12.6 g/dL (ref 11.7–15.5)
Lymphs Abs: 1074 cells/uL (ref 850–3900)
MCH: 30.6 pg (ref 27.0–33.0)
MCHC: 33.6 g/dL (ref 32.0–36.0)
MCV: 91 fL (ref 80.0–100.0)
MONOS PCT: 8 %
MPV: 9.5 fL (ref 7.5–12.5)
Neutro Abs: 4112 cells/uL (ref 1500–7800)
Neutrophils Relative %: 69.7 %
Platelets: 249 10*3/uL (ref 140–400)
RBC: 4.12 10*6/uL (ref 3.80–5.10)
RDW: 11.9 % (ref 11.0–15.0)
TOTAL LYMPHOCYTE: 18.2 %
WBC: 5.9 10*3/uL (ref 3.8–10.8)
WBCMIX: 472 {cells}/uL (ref 200–950)

## 2017-08-03 LAB — TSH: TSH: 0.99 mIU/L

## 2017-08-03 LAB — LIPID PANEL
CHOL/HDL RATIO: 2.3 (calc) (ref <5.0)
CHOLESTEROL: 143 mg/dL (ref <200)
HDL: 62 mg/dL (ref >50)
LDL Cholesterol (Calc): 68 mg/dL (calc)
Non-HDL Cholesterol (Calc): 81 mg/dL (calc) (ref <130)
Triglycerides: 51 mg/dL (ref <150)

## 2017-08-03 LAB — VITAMIN D 25 HYDROXY (VIT D DEFICIENCY, FRACTURES): Vit D, 25-Hydroxy: 28 ng/mL — ABNORMAL LOW (ref 30–100)

## 2017-08-08 ENCOUNTER — Other Ambulatory Visit: Payer: Self-pay

## 2017-08-08 ENCOUNTER — Ambulatory Visit (HOSPITAL_COMMUNITY): Payer: BLUE CROSS/BLUE SHIELD | Attending: Cardiovascular Disease

## 2017-08-08 DIAGNOSIS — Z9189 Other specified personal risk factors, not elsewhere classified: Secondary | ICD-10-CM | POA: Diagnosis not present

## 2017-08-08 DIAGNOSIS — Z148 Genetic carrier of other disease: Secondary | ICD-10-CM | POA: Diagnosis not present

## 2017-11-18 DIAGNOSIS — D2262 Melanocytic nevi of left upper limb, including shoulder: Secondary | ICD-10-CM | POA: Diagnosis not present

## 2017-11-18 DIAGNOSIS — B078 Other viral warts: Secondary | ICD-10-CM | POA: Diagnosis not present

## 2017-11-18 DIAGNOSIS — D2261 Melanocytic nevi of right upper limb, including shoulder: Secondary | ICD-10-CM | POA: Diagnosis not present

## 2017-11-18 DIAGNOSIS — D2271 Melanocytic nevi of right lower limb, including hip: Secondary | ICD-10-CM | POA: Diagnosis not present

## 2017-11-18 DIAGNOSIS — D225 Melanocytic nevi of trunk: Secondary | ICD-10-CM | POA: Diagnosis not present

## 2017-12-25 DIAGNOSIS — Z1231 Encounter for screening mammogram for malignant neoplasm of breast: Secondary | ICD-10-CM | POA: Diagnosis not present

## 2017-12-25 DIAGNOSIS — Z01419 Encounter for gynecological examination (general) (routine) without abnormal findings: Secondary | ICD-10-CM | POA: Diagnosis not present

## 2017-12-25 DIAGNOSIS — Z6824 Body mass index (BMI) 24.0-24.9, adult: Secondary | ICD-10-CM | POA: Diagnosis not present

## 2018-05-12 ENCOUNTER — Ambulatory Visit (INDEPENDENT_AMBULATORY_CARE_PROVIDER_SITE_OTHER): Payer: BLUE CROSS/BLUE SHIELD | Admitting: Family Medicine

## 2018-05-12 ENCOUNTER — Encounter: Payer: Self-pay | Admitting: Family Medicine

## 2018-05-12 VITALS — BP 120/76 | HR 88 | Temp 98.0°F | Resp 16 | Ht 68.0 in | Wt 160.0 lb

## 2018-05-12 DIAGNOSIS — K219 Gastro-esophageal reflux disease without esophagitis: Secondary | ICD-10-CM

## 2018-05-12 DIAGNOSIS — R1013 Epigastric pain: Secondary | ICD-10-CM | POA: Diagnosis not present

## 2018-05-12 NOTE — Patient Instructions (Signed)
Pay attention to the foods you are eating to see if there are particular triggers (dairy, kale, other raw vegetables, greens, etc).  You can try Beano prior to a meal that triggers gas, vs using simethicone (Gas-X) after you have already developed gas/bloating/discomfort.  There likely is a component of reflux (mostly over vacation). Consider doing a 2 week trial of Prilosec OTC--I doubt you will need this long-term   Food Choices for Gastroesophageal Reflux Disease, Adult When you have gastroesophageal reflux disease (GERD), the foods you eat and your eating habits are very important. Choosing the right foods can help ease the discomfort of GERD. Consider working with a diet and nutrition specialist (dietitian) to help you make healthy food choices. What general guidelines should I follow? Eating plan  Choose healthy foods low in fat, such as fruits, vegetables, whole grains, low-fat dairy products, and lean meat, fish, and poultry.  Eat frequent, small meals instead of three large meals each day. Eat your meals slowly, in a relaxed setting. Avoid bending over or lying down until 2-3 hours after eating.  Limit high-fat foods such as fatty meats or fried foods.  Limit your intake of oils, butter, and shortening to less than 8 teaspoons each day.  Avoid the following: ? Foods that cause symptoms. These may be different for different people. Keep a food diary to keep track of foods that cause symptoms. ? Alcohol. ? Drinking large amounts of liquid with meals. ? Eating meals during the 2-3 hours before bed.  Cook foods using methods other than frying. This may include baking, grilling, or broiling. Lifestyle   Maintain a healthy weight. Ask your health care provider what weight is healthy for you. If you need to lose weight, work with your health care provider to do so safely.  Exercise for at least 30 minutes on 5 or more days each week, or as told by your health care provider.  Avoid  wearing clothes that fit tightly around your waist and chest.  Do not use any products that contain nicotine or tobacco, such as cigarettes and e-cigarettes. If you need help quitting, ask your health care provider.  Sleep with the head of your bed raised. Use a wedge under the mattress or blocks under the bed frame to raise the head of the bed. What foods are not recommended? The items listed may not be a complete list. Talk with your dietitian about what dietary choices are best for you. Grains Pastries or quick breads with added fat. [place] toast. Vegetables Deep fried vegetables. [place] fries. Any vegetables prepared with added fat. Any vegetables that cause symptoms. For some people this may include tomatoes and tomato products, chili peppers, onions and garlic, and horseradish. Fruits Any fruits prepared with added fat. Any fruits that cause symptoms. For some people this may include citrus fruits, such as oranges, grapefruit, pineapple, and lemons. Meats and other protein foods High-fat meats, such as fatty beef or pork, hot dogs, ribs, ham, sausage, salami and bacon. Fried meat or protein, including fried fish and fried chicken. Nuts and nut butters. Dairy Whole milk and chocolate milk. Sour cream. Cream. Ice cream. Cream cheese. Milk shakes. Beverages Coffee and tea, with or without caffeine. Carbonated beverages. Sodas. Energy drinks. Fruit juice made with acidic fruits (such as orange or grapefruit). Tomato juice. Alcoholic drinks. Fats and oils Butter. Margarine. Shortening. Ghee. Sweets and desserts Chocolate and cocoa. Donuts. Seasoning and other foods Pepper. Peppermint and spearmint. Any condiments, herbs, or seasonings that  cause symptoms. For some people, this may include curry, hot sauce, or vinegar-based salad dressings. Summary  When you have gastroesophageal reflux disease (GERD), food and lifestyle choices are very important to help ease the discomfort of  GERD.  Eat frequent, small meals instead of three large meals each day. Eat your meals slowly, in a relaxed setting. Avoid bending over or lying down until 2-3 hours after eating.  Limit high-fat foods such as fatty meat or fried foods. This information is not intended to replace advice given to you by your health care provider. Make sure you discuss any questions you have with your health care provider. Document Released: 09/24/2005 Document Revised: 09/25/2016 Document Reviewed: 09/25/2016 Elsevier Interactive Patient Education  [employer].

## 2018-05-12 NOTE — Progress Notes (Signed)
Chief Complaint  Patient presents with  . abdominal issues    upper gastric bloating, burning sensation left side, uncomfortable feeling   2 weeks ago she started with epigastric discomfort, could see "muscle undulations", like a baby kicking.  Noticed it while reading in bed, was not painful. She then went on vacation, and 2-3 days later (in [place] she developed a wider area of symptoms, spreading to the left side of her upper abdomen, worse at night when laying down.  A few nights she couldn't sleep on her left side.  Not tender to touch.  No constipation, normal bowels, no blood or mucus.  She did have some heartburn, only for a few nights while on vacation, resolved.  She took Pepto Bismol for 2 days while on vacation, took the edge off. Normal appetite.  Overall is improved, about 90% better. She denies any exertional chest pain, hasn't had any problems when exercising.  Has IUD. Denies pregnancy  PMH, PSH, SH reviewed Pt had echo (due to FHx of limb/girdle muscular dystrophy, at risk for cardiomyopathy) on 08/2017, normal.  Outpatient Encounter Medications as of 05/12/2018  Medication Sig Note  . cholecalciferol (VITAMIN D) 1000 units tablet Take 1,000 Units by mouth daily.   Marland Kitchen. levonorgestrel (MIRENA) 20 MCG/24HR IUD 1 each by Intrauterine route once.   . [DISCONTINUED] Multiple Vitamins-Minerals (MULTIVITAMIN WITH MINERALS) tablet Take 1 tablet by mouth daily. 08/01/2017: Maybe twice weekly    No facility-administered encounter medications on file as of 05/12/2018.    Allergies  Allergen Reactions  . Bee Venom Anaphylaxis  . Sulfa Drugs Cross Reactors Hives   ROS: no fever, chills, URI symptoms, vomiting, bowel changes, bleeding, bruising. Chest discomfort, vague, left sided, as per HPI, resolved.  See HPI   PHYSICAL EXAM:  BP 120/76   Pulse 88   Temp 98 F (36.7 C) (Oral)   Resp 16   Ht 5\' 8"  (1.727 m)   Wt 160 lb (72.6 kg)   SpO2 98%   BMI 24.33 kg/m    Well-appearing, pleasant female in no distress HEENT: conjunctiva and sclera are clear, EOMI, OP clear Neck: no lymphadenopathy, thyromegaly or mass Heart: regular rate and rhythm, no murmur Lungs: clear bilaterally Chest wall: nontender Abdomen: normal bowel sounds, soft, nontender, no organomegaly or mass Extremities: no edema, normal pulses Neuro: alert and oriented, cranial nerves intact, normal gait Psych: normal mood, affect, hygiene and grooming   ASSESSMENT/PLAN:   Gastroesophageal reflux disease without esophagitis - mostly resolved. Reviewed diet, trial of OTC PPI (vs H2) x 2 weeks. f/u if persistent chest pain  Epigastric discomfort - no pain, but more movement/fluttering, suspect related to gas. Discussed foods which can contribute, discussed simethicone vs Beano prn    Pay attention to the foods you are eating to see if there are particular triggers (dairy, kale, other raw vegetables, greens, etc).  You can try Beano prior to a meal that triggers gas, vs using simethicone (Gas-X) after you have already developed gas/bloating/discomfort.  There likely is a component of reflux (mostly over vacation). Consider doing a 2 week trial of Prilosec OTC--I doubt you will need this long-term

## 2018-05-19 ENCOUNTER — Encounter: Payer: Self-pay | Admitting: Family Medicine

## 2018-05-29 ENCOUNTER — Ambulatory Visit: Payer: BLUE CROSS/BLUE SHIELD | Admitting: Family Medicine

## 2018-06-02 ENCOUNTER — Encounter: Payer: Self-pay | Admitting: Family Medicine

## 2018-06-02 ENCOUNTER — Ambulatory Visit (INDEPENDENT_AMBULATORY_CARE_PROVIDER_SITE_OTHER): Payer: BLUE CROSS/BLUE SHIELD | Admitting: Family Medicine

## 2018-06-02 VITALS — BP 108/72 | HR 76 | Temp 98.3°F | Ht 68.0 in | Wt 160.0 lb

## 2018-06-02 DIAGNOSIS — R109 Unspecified abdominal pain: Secondary | ICD-10-CM | POA: Diagnosis not present

## 2018-06-02 DIAGNOSIS — R1084 Generalized abdominal pain: Secondary | ICD-10-CM

## 2018-06-02 LAB — POCT URINALYSIS DIP (PROADVANTAGE DEVICE)
BILIRUBIN UA: NEGATIVE
Blood, UA: NEGATIVE
GLUCOSE UA: NEGATIVE mg/dL
Ketones, POC UA: NEGATIVE mg/dL
Leukocytes, UA: NEGATIVE
Nitrite, UA: NEGATIVE
Protein Ur, POC: NEGATIVE mg/dL
Specific Gravity, Urine: 1.005
UUROB: NEGATIVE
pH, UA: 7.5 (ref 5.0–8.0)

## 2018-06-02 NOTE — Patient Instructions (Signed)
  Continue high fiber diet, adequate hydration and probiotics for now. Pay attention to symptoms and let us know if they change--specifically any fever, focal area of persistent discomfort, blood or mucus in stool, or other changes. We are checking blood counts and chem panel--if everything is normal, let's just "watch and wait" to see if symptoms continue to gradually improve/resolve.  If you have persistent discomfort, we will need to decide CT vs colonoscopy (depending on your symptoms).

## 2018-06-02 NOTE — Progress Notes (Signed)
Chief Complaint  Patient presents with  . Abdominal Pain    that is somewhat better with eating smaller meals but has changed. Worse in the left side, esp when she lays on that side. Gas-X doesn't help and what she eats doesn't really make a difference.    Patient presents with complaint of persistent abdominal complaints.   She was seen after returning from vacation 8/5, with 2 weeks of epigastric discomfort, some heartburn/reflux, chets complaints and left upper abdominal pain.  No further "undulations"/movement of epigastrium. Also no further chest "fullness" or reflux. Since her last visit she took 2 week trial of Prilosec OTC, as well as taking probiotic (generic for Align).  She took Miralax for a couple of days when she felt like she was constipated.  Bowels are now normal, no straining, soft, going daily.  If she eats a full meal, she gets a fullness, discomfort, more on the left. She has a little more discomfort on the left when she lays on the left, but better than at last visit.  Overall, discomfort hasn't gone away, but it has been getting a little better, but afraid of missing something.  She gets some sporadic short-lived pain in different spots of abdomen.  Always is better if eating smaller meals.  2 weeks ago she had heavy spotting (clotty discharge), lasting 3-4 days.  She has IUD and rarely spots.  Sees GYN  PMH, PSH, SH reviewed  Outpatient Encounter Medications as of 06/02/2018  Medication Sig  . cholecalciferol (VITAMIN D) 1000 units tablet Take 1,000 Units by mouth daily.  . Probiotic Product (PROBIOTIC DAILY PO) Take 1 capsule by mouth daily.  Marland Kitchen. levonorgestrel (MIRENA) 20 MCG/24HR IUD 1 each by Intrauterine route once.   No facility-administered encounter medications on file as of 06/02/2018.    Allergies  Allergen Reactions  . Bee Venom Anaphylaxis  . Sulfa Drugs Cross Reactors Hives   ROS: No fever, chills, URI/allergy symptoms, chest pain, heartburn, bowel  changes, dysuria  See HPI.  PHYSICAL EXAM:  BP 108/72   Pulse 76   Temp 98.3 F (36.8 C) (Tympanic)   Ht 5\' 8"  (1.727 m)   Wt 160 lb (72.6 kg)   BMI 24.33 kg/m   Wt Readings from Last 3 Encounters:  06/02/18 160 lb (72.6 kg)  05/12/18 160 lb (72.6 kg)  08/01/17 161 lb (73 kg)   Well appearing, pleasant female in no distress  HEENT: conjunctiva and sclera are clear, anicteric. EOMI Neck: no lymphadenopathy or mass Heart: regular rate and rhythm Lungs: clear bilaterally Abdomen: Mildly tender at epigastrium and LUQ No hepatosplenomegaly No rebound, guarding or masses. Extremities: no edema Skin: normal turgor, no rash  Normal urine dip.   ASSESSMENT/PLAN:  Generalized abdominal pain - gradually improving; DDx reviewed in detail. Check labs. Consider CT (vs refer to GI for colonoscopy vs both) if persistent/worsening pain - Plan: CBC with Differential/Platelet, Comprehensive metabolic panel, Celiac Panel  Abdominal pain, unspecified abdominal location - Plan: POCT Urinalysis DIP (Proadvantage Device)  Discussed Ddx in detail (including the scary things that she is worried about--pancreatic cancer, ovarian, etc). Discussed dairy, gluten.  Doubt celiac, but will go ahead and check labs now, in case she decides to try gluten-free diet (which would make testing less accurate)   Continue high fiber diet, adequate hydration and probiotics for now. Pay attention to symptoms and let us know if they change--specifically any fever, focal area of persistent discomfort, blood or mucus in stool, or other changes.  We are checking blood counts and chem panel--if everything is normal, let's just "watch and wait" to see if symptoms continue to gradually improve/resolve.  If you have persistent discomfort, we will need to decide CT vs colonoscopy (depending on your symptoms).

## 2018-06-03 ENCOUNTER — Encounter: Payer: Self-pay | Admitting: Family Medicine

## 2018-06-03 LAB — CBC WITH DIFFERENTIAL/PLATELET
Basophils Absolute: 0 10*3/uL (ref 0.0–0.2)
Basos: 0 %
EOS (ABSOLUTE): 0.1 10*3/uL (ref 0.0–0.4)
EOS: 1 %
HEMATOCRIT: 41.1 % (ref 34.0–46.6)
HEMOGLOBIN: 13.6 g/dL (ref 11.1–15.9)
IMMATURE GRANS (ABS): 0 10*3/uL (ref 0.0–0.1)
IMMATURE GRANULOCYTES: 0 %
LYMPHS: 15 %
Lymphocytes Absolute: 1.2 10*3/uL (ref 0.7–3.1)
MCH: 30.1 pg (ref 26.6–33.0)
MCHC: 33.1 g/dL (ref 31.5–35.7)
MCV: 91 fL (ref 79–97)
MONOCYTES: 7 %
Monocytes Absolute: 0.5 10*3/uL (ref 0.1–0.9)
NEUTROS PCT: 77 %
Neutrophils Absolute: 6.1 10*3/uL (ref 1.4–7.0)
Platelets: 269 10*3/uL (ref 150–450)
RBC: 4.52 x10E6/uL (ref 3.77–5.28)
RDW: 12.5 % (ref 12.3–15.4)
WBC: 8 10*3/uL (ref 3.4–10.8)

## 2018-06-03 LAB — COMPREHENSIVE METABOLIC PANEL
ALBUMIN: 4.6 g/dL (ref 3.5–5.5)
ALT: 8 IU/L (ref 0–32)
AST: 15 IU/L (ref 0–40)
Albumin/Globulin Ratio: 1.8 (ref 1.2–2.2)
Alkaline Phosphatase: 56 IU/L (ref 39–117)
BUN/Creatinine Ratio: 13 (ref 9–23)
BUN: 11 mg/dL (ref 6–24)
Bilirubin Total: 1.6 mg/dL — ABNORMAL HIGH (ref 0.0–1.2)
CALCIUM: 9.5 mg/dL (ref 8.7–10.2)
CO2: 22 mmol/L (ref 20–29)
CREATININE: 0.82 mg/dL (ref 0.57–1.00)
Chloride: 102 mmol/L (ref 96–106)
GFR, EST AFRICAN AMERICAN: 103 mL/min/{1.73_m2} (ref >59)
GFR, EST NON AFRICAN AMERICAN: 89 mL/min/{1.73_m2} (ref >59)
GLOBULIN, TOTAL: 2.5 g/dL (ref 1.5–4.5)
Glucose: 97 mg/dL (ref 65–99)
Potassium: 4.3 mmol/L (ref 3.5–5.2)
SODIUM: 139 mmol/L (ref 134–144)
TOTAL PROTEIN: 7.1 g/dL (ref 6.0–8.5)

## 2018-06-03 LAB — GLIA (IGA/G) + TTG IGA
Antigliadin Abs, IgA: 3 units (ref 0–19)
GLIADIN IGG: 39 U — AB (ref 0–19)
Transglutaminase IgA: <2 U/mL (ref 0–3)

## 2018-06-04 ENCOUNTER — Encounter: Payer: Self-pay | Admitting: Family Medicine

## 2018-06-05 ENCOUNTER — Encounter: Payer: Self-pay | Admitting: Family Medicine

## 2018-06-06 ENCOUNTER — Encounter: Payer: Self-pay | Admitting: Family Medicine

## 2018-06-13 ENCOUNTER — Telehealth: Payer: Self-pay | Admitting: Family Medicine

## 2018-06-13 NOTE — Telephone Encounter (Signed)
  Patient called stating she has been seeing Dr. Lynelle Doctor for  ongoing abdominal issues, pain, bloating churning She recommended gluten free diet and referred her to GI She did say that she has noticed a little improvement with the diet change. She called today because she had a bowl movement and noticed some bright red blood in the water. She did not notice it in the actual stool or on the paper. She wants to know if she needs to be seen for this issue now or wait to follow up with GI  Spoke with Dr. Susann Givens and advised patient that unless she is seeing a lot of blood and she states that she is not seeing a lot, that she can just follow up with GI Did advise patient that she can call the office # after hours or over weekend if she has any other issues or anything changes

## 2018-06-15 NOTE — Progress Notes (Deleted)
   Patient presents for f/u abdominal pain, with episode of blood in stool last week. She has been referred to [facility], and has appointment scheduled for  She wrote [place] 9/4: "I've been eating gluten-free since last week, and I was feeling slightly better. I still didn't feel normal, but this week was the best I've felt. This morning I woke up with sharper pains in my left abdomen, they eased as the day went on.  But about an hour or two ago, I had a bowel movement. There was blood in the toilet, that seemed to be coming from the stool. (There were no drops of blood, and none on the toilet paper). I called your office, and spoke with someone who asked Dr. Susann Givens about it. He said that unless there was a lot of blood, just to keep my appointment with the GI doctor. My bowel movements haven't been quite normal. Occasionally they are normal, but I have smaller ones in between.   However, the appointment isn't for several more weeks, and at this point I'm getting worried."   Wt Readings from Last 3 Encounters:  06/02/18 160 lb (72.6 kg)  05/12/18 160 lb (72.6 kg)  08/01/17 161 lb (73 kg)

## 2018-06-16 ENCOUNTER — Ambulatory Visit: Payer: BLUE CROSS/BLUE SHIELD | Admitting: Family Medicine

## 2018-06-19 DIAGNOSIS — K625 Hemorrhage of anus and rectum: Secondary | ICD-10-CM | POA: Diagnosis not present

## 2018-06-19 DIAGNOSIS — R1031 Right lower quadrant pain: Secondary | ICD-10-CM | POA: Diagnosis not present

## 2018-06-19 DIAGNOSIS — R194 Change in bowel habit: Secondary | ICD-10-CM | POA: Diagnosis not present

## 2018-06-19 DIAGNOSIS — R140 Abdominal distension (gaseous): Secondary | ICD-10-CM | POA: Diagnosis not present

## 2018-06-25 DIAGNOSIS — K635 Polyp of colon: Secondary | ICD-10-CM | POA: Diagnosis not present

## 2018-06-25 DIAGNOSIS — D120 Benign neoplasm of cecum: Secondary | ICD-10-CM | POA: Diagnosis not present

## 2018-06-25 DIAGNOSIS — D125 Benign neoplasm of sigmoid colon: Secondary | ICD-10-CM | POA: Diagnosis not present

## 2018-06-25 DIAGNOSIS — Z1211 Encounter for screening for malignant neoplasm of colon: Secondary | ICD-10-CM | POA: Diagnosis not present

## 2018-06-25 DIAGNOSIS — K3189 Other diseases of stomach and duodenum: Secondary | ICD-10-CM | POA: Diagnosis not present

## 2018-06-25 DIAGNOSIS — D123 Benign neoplasm of transverse colon: Secondary | ICD-10-CM | POA: Diagnosis not present

## 2018-06-25 DIAGNOSIS — K208 Other esophagitis: Secondary | ICD-10-CM | POA: Diagnosis not present

## 2018-06-25 DIAGNOSIS — R194 Change in bowel habit: Secondary | ICD-10-CM | POA: Diagnosis not present

## 2018-06-25 DIAGNOSIS — R1033 Periumbilical pain: Secondary | ICD-10-CM | POA: Diagnosis not present

## 2018-06-25 DIAGNOSIS — R110 Nausea: Secondary | ICD-10-CM | POA: Diagnosis not present

## 2018-06-25 LAB — HM COLONOSCOPY

## 2018-06-26 LAB — SPECIMEN STATUS REPORT

## 2018-06-26 LAB — IGA: IgA/Immunoglobulin A, Serum: 147 mg/dL (ref 87–352)

## 2018-07-07 ENCOUNTER — Encounter: Payer: Self-pay | Admitting: Family Medicine

## 2018-07-07 ENCOUNTER — Other Ambulatory Visit: Payer: Self-pay | Admitting: Radiology

## 2018-07-07 DIAGNOSIS — Z3200 Encounter for pregnancy test, result unknown: Secondary | ICD-10-CM | POA: Diagnosis not present

## 2018-07-07 DIAGNOSIS — R1084 Generalized abdominal pain: Secondary | ICD-10-CM | POA: Diagnosis not present

## 2018-07-07 DIAGNOSIS — N6310 Unspecified lump in the right breast, unspecified quadrant: Secondary | ICD-10-CM

## 2018-07-07 DIAGNOSIS — R140 Abdominal distension (gaseous): Secondary | ICD-10-CM | POA: Diagnosis not present

## 2018-07-09 ENCOUNTER — Encounter: Payer: Self-pay | Admitting: Family Medicine

## 2018-07-10 ENCOUNTER — Ambulatory Visit
Admission: RE | Admit: 2018-07-10 | Discharge: 2018-07-10 | Disposition: A | Discharge status: Home / Self Care | Payer: BLUE CROSS/BLUE SHIELD | Source: Ambulatory Visit | Attending: Radiology | Admitting: Radiology

## 2018-07-10 DIAGNOSIS — R922 Inconclusive mammogram: Secondary | ICD-10-CM | POA: Diagnosis not present

## 2018-07-10 DIAGNOSIS — N6011 Diffuse cystic mastopathy of right breast: Secondary | ICD-10-CM | POA: Diagnosis not present

## 2018-07-10 DIAGNOSIS — N6310 Unspecified lump in the right breast, unspecified quadrant: Secondary | ICD-10-CM

## 2018-07-14 ENCOUNTER — Encounter: Payer: Self-pay | Admitting: *Deleted

## 2018-07-15 ENCOUNTER — Encounter: Payer: Self-pay | Admitting: Family Medicine

## 2018-07-15 DIAGNOSIS — R102 Pelvic and perineal pain: Secondary | ICD-10-CM | POA: Diagnosis not present

## 2018-07-15 DIAGNOSIS — R140 Abdominal distension (gaseous): Secondary | ICD-10-CM | POA: Diagnosis not present

## 2018-07-26 DIAGNOSIS — Z23 Encounter for immunization: Secondary | ICD-10-CM | POA: Diagnosis not present

## 2018-08-14 DIAGNOSIS — Z86010 Personal history of colonic polyps: Secondary | ICD-10-CM | POA: Diagnosis not present

## 2018-08-14 DIAGNOSIS — K9041 Non-celiac gluten sensitivity: Secondary | ICD-10-CM | POA: Diagnosis not present

## 2018-08-28 ENCOUNTER — Ambulatory Visit (INDEPENDENT_AMBULATORY_CARE_PROVIDER_SITE_OTHER): Payer: BLUE CROSS/BLUE SHIELD | Admitting: Family Medicine

## 2018-08-28 ENCOUNTER — Encounter: Payer: Self-pay | Admitting: Family Medicine

## 2018-08-28 VITALS — BP 130/80 | HR 88 | Temp 97.0°F | Ht 68.0 in | Wt 151.0 lb

## 2018-08-28 DIAGNOSIS — E559 Vitamin D deficiency, unspecified: Secondary | ICD-10-CM | POA: Diagnosis not present

## 2018-08-28 DIAGNOSIS — R634 Abnormal weight loss: Secondary | ICD-10-CM

## 2018-08-28 DIAGNOSIS — J069 Acute upper respiratory infection, unspecified: Secondary | ICD-10-CM | POA: Diagnosis not present

## 2018-08-28 DIAGNOSIS — K9041 Non-celiac gluten sensitivity: Secondary | ICD-10-CM

## 2018-08-28 DIAGNOSIS — R5383 Other fatigue: Secondary | ICD-10-CM | POA: Diagnosis not present

## 2018-08-28 DIAGNOSIS — R253 Fasciculation: Secondary | ICD-10-CM

## 2018-08-28 NOTE — Progress Notes (Signed)
Chief Complaint  Patient presents with  . Nasal Congestion    mostly mucus in her throat. Slight cough. Mucus is clear/yellow. No fevers.   . Palpitations    has noticed that her heart rate has been between 90-100 and will go down as low as low as 80 per her Fitbit. No dietary changes. Wants to know if she should be concerned or if anxiety can play into this.    She has had a cold since Sunday night (4 days).  3 days ago she noticed her pulse was faster.  It has persisted all week, and maybe got worse last night. She isn't sure how accurate her AppleWatch is for checking her pulse. Usually high 60's at rest.  Hasn't seen lower than 80 in the last few days. Last night she could feel her pulse more in her chest.  Felt heavy, faster than usual, and harder.  Not irregular.  Pulse was 90. She had a hard time falling asleep. Tried sleep meditation, thinks it came down some (83), but still had trouble sleeping.  Drinks 2-3 cups coffee/d.  Herbal tea in afternoons.  No alcohol, chocolate this week.  She saw Dr. Collene Mares 2 weeks ago, has been gluten-free x 8 weeks. Abdominal issues (bloating, bowel change) have resolved. Current probiotic helps.  She has noticed some muscle twitches in her calves and glutes since being gluten-free.  2 weeks ago seemed to be the peak/worse. She mentioned to Dr. Collene Mares, who suggested tonic water. Could see right calf twitch, was most constant in the calf, less frequent in the gluteal muscles.  Thinks something is inflamed in her body.  Denies muscle cramps, not painful  9# wt lost since August--relates to change in diet since gluten-free  No changes in hair/skin/bowels/energy. Ovarian cysts noted on [place] (results not available).  PMH, PSH, SH reviewed  Outpatient Encounter Medications as of 08/28/2018  Medication Sig Note  . cholecalciferol (VITAMIN D) 1000 units tablet Take 1,000 Units by mouth daily.   Marland Kitchen levonorgestrel (MIRENA) 20 MCG/24HR IUD 1 each by Intrauterine  route once.   . Probiotic Product (PROBIOTIC DAILY PO) Take 1 capsule by mouth daily. 08/28/2018: Metagenix UltraFlora IB   No facility-administered encounter medications on file as of 08/28/2018.    Takes vitamin D 3x/week  Allergies  Allergen Reactions  . Bee Venom Anaphylaxis  . Sulfa Drugs Cross Reactors Hives   ROS: no fever, chills, headaches, dizziness, chest pain.  URI symptoms and faster/harder pulse as per HPI. No nausea, vomiting, bowel changes (resolved), changes to hair/skin/nails. +weight loss. Moods are good   PHYSICAL EXAM:  BP 130/80   Pulse 88   Temp (!) 97 F (36.1 C) (Tympanic)   Ht _0  (1.727 m)   Wt 151 lb (68.5 kg)   BMI 22.96 kg/m    Wt Readings from Last 3 Encounters:  08/28/18 151 lb (68.5 kg)  06/02/18 160 lb (72.6 kg)  05/12/18 160 lb (72.6 kg)   Well-appearing, pleasant female in no distress HEENT: PERL, EOMI, conjunctiva and sclera are clear.  Mild edema in nares, no erythema or purulence.  TM's and EAC's normal.  There is trace erythema of anterior tonsillar pillars bilaterally, otherwise OP is clear. Sinuses are nontender. Neck: no lymphadenopathy, thyromegaly or mass Heart: regular rate and rhythm, no murmur, rug, gallop or ectopy noted Lungs: clear bilaterally Abdomen: soft, nontender Extremities: no masses, no fasciculations. No edema, no pulses Neuro: alert and oriented, cranial nerves intact. Normal strength, gait. Normal  reflexes, symmetric bilaterally Psych: normal mood, affect, hygiene and grooming   ASSESSMENT/PLAN:   Acute upper respiratory infection - suspect viral, no evidence of bacterial infection. Supportive measures  Fatigue, unspecified type - Plan: Comprehensive metabolic panel, CBC with Differential/Platelet, TSH, VITAMIN D 25 Hydroxy (Vit-D Deficiency, Fractures)  Fasciculations - normal neuro exam; are diffuse, not focal, and have gotten less. Return if worsening, other neuro sx develop - Plan: Magnesium,  Comprehensive metabolic panel, Sedimentation Rate  Weight loss - likely intentional, related to dietary changes - Plan: TSH  Vitamin D deficiency - noncompliant with supplement, recheck level - Plan: VITAMIN D 25 Hydroxy (Vit-D Deficiency, Fractures)  Gluten intolerance - pt states Dr. Collene Mares said she did not have celiac disease.  Doing much better on gluten-free diet and probiotics   CBC, c-met, Mg, TSH, ESR

## 2018-08-29 LAB — CBC WITH DIFFERENTIAL/PLATELET
BASOS: 0 %
Basophils Absolute: 0 10*3/uL (ref 0.0–0.2)
EOS (ABSOLUTE): 0.1 10*3/uL (ref 0.0–0.4)
Eos: 1 %
HEMATOCRIT: 38.2 % (ref 34.0–46.6)
Hemoglobin: 13.1 g/dL (ref 11.1–15.9)
IMMATURE GRANULOCYTES: 1 %
Immature Grans (Abs): 0.1 10*3/uL (ref 0.0–0.1)
Lymphocytes Absolute: 1.1 10*3/uL (ref 0.7–3.1)
Lymphs: 10 %
MCH: 31.2 pg (ref 26.6–33.0)
MCHC: 34.3 g/dL (ref 31.5–35.7)
MCV: 91 fL (ref 79–97)
MONOS ABS: 0.5 10*3/uL (ref 0.1–0.9)
Monocytes: 5 %
NEUTROS PCT: 83 %
Neutrophils Absolute: 9.6 10*3/uL — ABNORMAL HIGH (ref 1.4–7.0)
PLATELETS: 306 10*3/uL (ref 150–450)
RBC: 4.2 x10E6/uL (ref 3.77–5.28)
RDW: 12.4 % (ref 12.3–15.4)
WBC: 11.4 10*3/uL — AB (ref 3.4–10.8)

## 2018-08-29 LAB — COMPREHENSIVE METABOLIC PANEL
A/G RATIO: 2 (ref 1.2–2.2)
ALT: 12 IU/L (ref 0–32)
AST: 15 IU/L (ref 0–40)
Albumin: 4.6 g/dL (ref 3.5–5.5)
Alkaline Phosphatase: 59 IU/L (ref 39–117)
BILIRUBIN TOTAL: 1.5 mg/dL — AB (ref 0.0–1.2)
BUN/Creatinine Ratio: 12 (ref 9–23)
BUN: 8 mg/dL (ref 6–24)
CO2: 23 mmol/L (ref 20–29)
Calcium: 9.5 mg/dL (ref 8.7–10.2)
Chloride: 101 mmol/L (ref 96–106)
Creatinine, Ser: 0.65 mg/dL (ref 0.57–1.00)
GFR calc Af Amer: 128 mL/min/{1.73_m2} (ref >59)
GFR calc non Af Amer: 111 mL/min/{1.73_m2} (ref >59)
GLOBULIN, TOTAL: 2.3 g/dL (ref 1.5–4.5)
Glucose: 99 mg/dL (ref 65–99)
POTASSIUM: 4.3 mmol/L (ref 3.5–5.2)
SODIUM: 138 mmol/L (ref 134–144)
Total Protein: 6.9 g/dL (ref 6.0–8.5)

## 2018-08-29 LAB — SEDIMENTATION RATE: SED RATE: 4 mm/h (ref 0–32)

## 2018-08-29 LAB — MAGNESIUM: Magnesium: 2.2 mg/dL (ref 1.6–2.3)

## 2018-08-29 LAB — TSH: TSH: 2.07 u[IU]/mL (ref 0.450–4.500)

## 2018-08-29 LAB — VITAMIN D 25 HYDROXY (VIT D DEFICIENCY, FRACTURES): Vit D, 25-Hydroxy: 26.3 ng/mL — ABNORMAL LOW (ref 30.0–100.0)

## 2018-08-30 ENCOUNTER — Encounter: Payer: Self-pay | Admitting: Family Medicine

## 2018-09-01 ENCOUNTER — Ambulatory Visit: Payer: BLUE CROSS/BLUE SHIELD | Admitting: Family Medicine

## 2018-09-07 ENCOUNTER — Encounter: Payer: Self-pay | Admitting: Family Medicine

## 2018-11-12 DIAGNOSIS — D225 Melanocytic nevi of trunk: Secondary | ICD-10-CM | POA: Diagnosis not present

## 2018-11-12 DIAGNOSIS — D485 Neoplasm of uncertain behavior of skin: Secondary | ICD-10-CM | POA: Diagnosis not present

## 2018-11-12 DIAGNOSIS — D22111 Melanocytic nevi of right upper eyelid, including canthus: Secondary | ICD-10-CM | POA: Diagnosis not present

## 2018-11-25 ENCOUNTER — Encounter: Payer: Self-pay | Admitting: Family Medicine

## 2018-11-25 DIAGNOSIS — L988 Other specified disorders of the skin and subcutaneous tissue: Secondary | ICD-10-CM | POA: Diagnosis not present

## 2018-11-25 DIAGNOSIS — D485 Neoplasm of uncertain behavior of skin: Secondary | ICD-10-CM | POA: Diagnosis not present

## 2019-04-28 DIAGNOSIS — Z6824 Body mass index (BMI) 24.0-24.9, adult: Secondary | ICD-10-CM | POA: Diagnosis not present

## 2019-04-28 DIAGNOSIS — Z1231 Encounter for screening mammogram for malignant neoplasm of breast: Secondary | ICD-10-CM | POA: Diagnosis not present

## 2019-04-28 DIAGNOSIS — Z01419 Encounter for gynecological examination (general) (routine) without abnormal findings: Secondary | ICD-10-CM | POA: Diagnosis not present

## 2019-04-30 ENCOUNTER — Other Ambulatory Visit: Payer: Self-pay | Admitting: Obstetrics and Gynecology

## 2019-04-30 DIAGNOSIS — R928 Other abnormal and inconclusive findings on diagnostic imaging of breast: Secondary | ICD-10-CM

## 2019-05-01 ENCOUNTER — Ambulatory Visit
Admission: RE | Admit: 2019-05-01 | Discharge: 2019-05-01 | Disposition: A | Discharge status: Home / Self Care | Payer: BC Managed Care – PPO | Source: Ambulatory Visit | Attending: Obstetrics and Gynecology | Admitting: Obstetrics and Gynecology

## 2019-05-01 ENCOUNTER — Other Ambulatory Visit: Payer: Self-pay

## 2019-05-01 ENCOUNTER — Ambulatory Visit
Admission: RE | Admit: 2019-05-01 | Discharge: 2019-05-01 | Disposition: A | Discharge status: Home / Self Care | Payer: BLUE CROSS/BLUE SHIELD | Source: Ambulatory Visit | Attending: Obstetrics and Gynecology | Admitting: Obstetrics and Gynecology

## 2019-05-01 DIAGNOSIS — R928 Other abnormal and inconclusive findings on diagnostic imaging of breast: Secondary | ICD-10-CM

## 2019-05-01 DIAGNOSIS — N6011 Diffuse cystic mastopathy of right breast: Secondary | ICD-10-CM | POA: Diagnosis not present

## 2019-08-11 DIAGNOSIS — L570 Actinic keratosis: Secondary | ICD-10-CM | POA: Diagnosis not present

## 2019-08-17 ENCOUNTER — Other Ambulatory Visit: Payer: Self-pay

## 2020-04-26 NOTE — Progress Notes (Signed)
Start time: 12:03 End time: 12:23   Virtual Visit via Telephone Note  I connected with Laura Henderson on 04/27/2020 by telephone, after attempting to connect by video.  She was unable to access video, so call was performed via telephone. I verified that I am speaking with the correct person using two identifiers.  Location: Patient: home, alone Provider: office   I discussed the limitations of evaluation and management by telemedicine and the availability of in person appointments. The patient expressed understanding and agreed to proceed.  History of Present Illness:  Chief Complaint  Patient presents with  . Nasal Congestion    VIRTUAL no cough. Started first week of June with cold and she did get over it but has never really gotten over the facial congestion. Does have some PND. No fever or any other symptom.    She is complaining of facial congestion since early June, after returning from the beach.  Her 2yo nephew who was with them was sick (cold, ear infection). Her daughters had a slight cold afterwards.  She has had persistent congestion, but otherwise has felt fine.  Denies cough, just the facial pressure.  Most prominent when she sits up in the morning, has PND, needs to blow her nose several times. Mucus is clear, and not thick. Currently her nasal passages are clear.  No loss of taste, smell, though slightly decreased.  Feels like her sinuses are clogged.  She gets an occasional tickle in her ear, slight itch.  Denies plugging/popping or pain.  She has been taking Allegra for a week.  This helps 10-15% (worse if she doesn't take it).  Hasn't tried decongestant yet. She also took zyrtec for a week, didn't do anything, so changed to [employer].  She has had her COVID vaccines.  PMH, PSH, SH reviewed  Outpatient Encounter Medications as of 04/27/2020  Medication Sig Note  . cholecalciferol (VITAMIN D) 1000 units tablet Take 1,000 Units by mouth daily.   . fexofenadine  (ALLEGRA) 180 MG tablet Take 180 mg by mouth daily.   Marland Kitchen levonorgestrel (MIRENA) 20 MCG/24HR IUD 1 each by Intrauterine route once.   . Probiotic Product (PROBIOTIC DAILY PO) Take 1 capsule by mouth daily. 08/28/2018: Metagenix UltraFlora IB   No facility-administered encounter medications on file as of 04/27/2020.   Allergies  Allergen Reactions  . Bee Venom Anaphylaxis  . Sulfa Drugs Cross Reactors Hives   ROS: No fever, chills.  Denies headaches, just the sinus pressure in both cheeks. Denies any frontal sinus pressure. No purulence.  No nausea, vomiting, bowel changes, bleeding, bruising, rash.  No body aches, fatigue, just the runny nose/pressure/PND per HPI   Observations/Objective:  Pulse 76   Temp 98.4 F (36.9 C) (Temporal)   Ht 5\' 8"  (1.727 m)   Wt 150 lb (68 kg)   BMI 22.81 kg/m   Patient is alert, oriented, in no distress She is in good spirits. She does not sound congested. Normal speech. Exam is limited due to virtual (telephone) nature of the visit.   Assessment and Plan:  Sinus congestion - given sx x 6-7 weeks, clear and not progressing, is c/w allergies. Cont Allegra, add decongestant. Consider Flonase, if needed. S/sx infection reviewed  Not suspicious for COVID, though did explain that if she gets any "cold" symptoms, low threshold for testing.   Continue to stay well hydrated. If your mucus gets thick, add Mucinex or robitussin to the antihistamines. Continue antihistamine (whichever is works the best). Decongestants can be  helpful (ie Sudafed) Consider sinus rinses if discomfort persists. Another option is to use an inhaled steroid such as Flonase.  Contact us if you develop worsening sinus pain and discolored mucus or phlegm.  Send dates/type or photo of COVID vaccination card via MyChart   Follow Up Instructions:    I discussed the assessment and treatment plan with the patient. The patient was provided an opportunity to ask questions and all  were answered. The patient agreed with the plan and demonstrated an understanding of the instructions.   The patient was advised to call back or seek an in-person evaluation if the symptoms worsen or if the condition fails to improve as anticipated.  I provided 20 minutes of non-face-to-face (telephone) time during this encounter, plus additional time documenting visit.   Lavonda Jumbo, MD

## 2020-04-27 ENCOUNTER — Encounter: Payer: Self-pay | Admitting: Family Medicine

## 2020-04-27 ENCOUNTER — Other Ambulatory Visit: Payer: Self-pay

## 2020-04-27 ENCOUNTER — Telehealth (INDEPENDENT_AMBULATORY_CARE_PROVIDER_SITE_OTHER): Payer: 59 | Admitting: Family Medicine

## 2020-04-27 VITALS — HR 76 | Temp 98.4°F | Ht 68.0 in | Wt 150.0 lb

## 2020-04-27 DIAGNOSIS — R0981 Nasal congestion: Secondary | ICD-10-CM | POA: Diagnosis not present

## 2020-04-27 NOTE — Patient Instructions (Signed)
Continue to stay well hydrated. If your mucus gets thick, add Mucinex or robitussin to the antihistamines. Continue antihistamine (whichever is works the best). Decongestants can be helpful (ie Sudafed) Consider sinus rinses if discomfort persists. Another option is to use an inhaled steroid such as Flonase.  Contact us if you develop worsening sinus pain and discolored mucus or phlegm.   Send dates/type or photo of COVID vaccination card via MyChart

## 2020-05-02 ENCOUNTER — Encounter: Payer: Self-pay | Admitting: Family Medicine

## 2020-07-28 IMAGING — US ULTRASOUND RIGHT BREAST LIMITED
1 series · 6 of 6 positions shown · non-contrast
Comparison: Prior exams

CLINICAL DATA: Patient presents with a palpable lump in the upper
right breast.

EXAM:
DIGITAL DIAGNOSTIC RIGHT MAMMOGRAM WITH CAD AND TOMO
ULTRASOUND RIGHT BREAST

[Series 1: ultrasound right breast limited · 0.06mm/px · 6 of 6 slices shown]
[im 1/6]
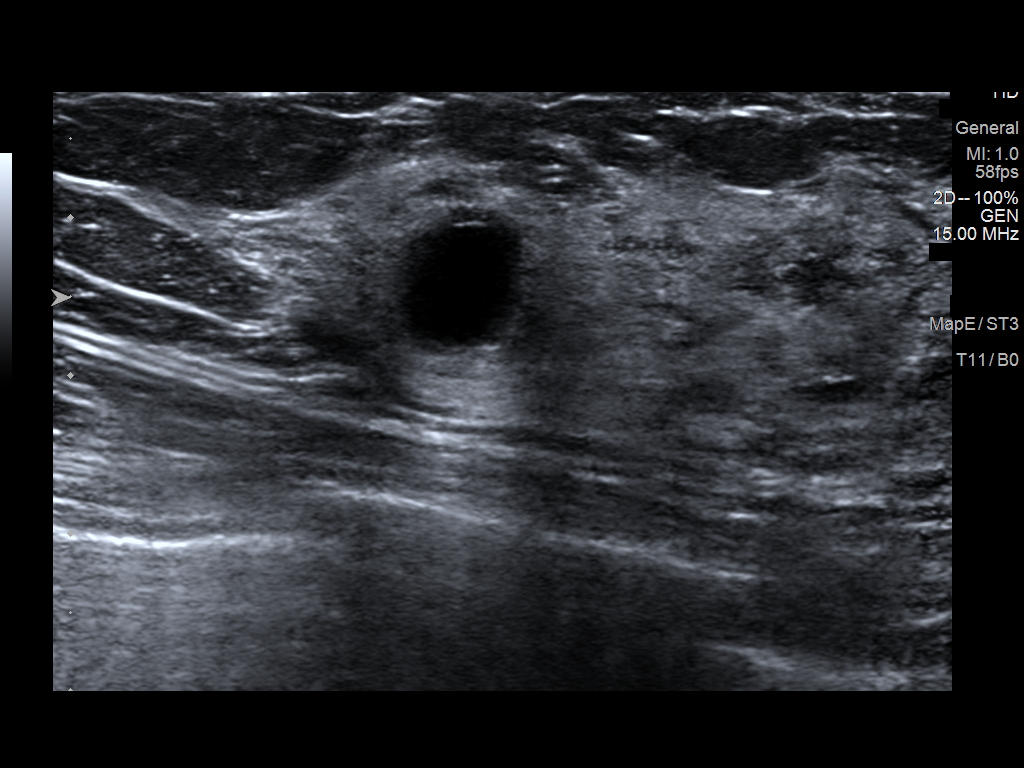
[im 2/6]
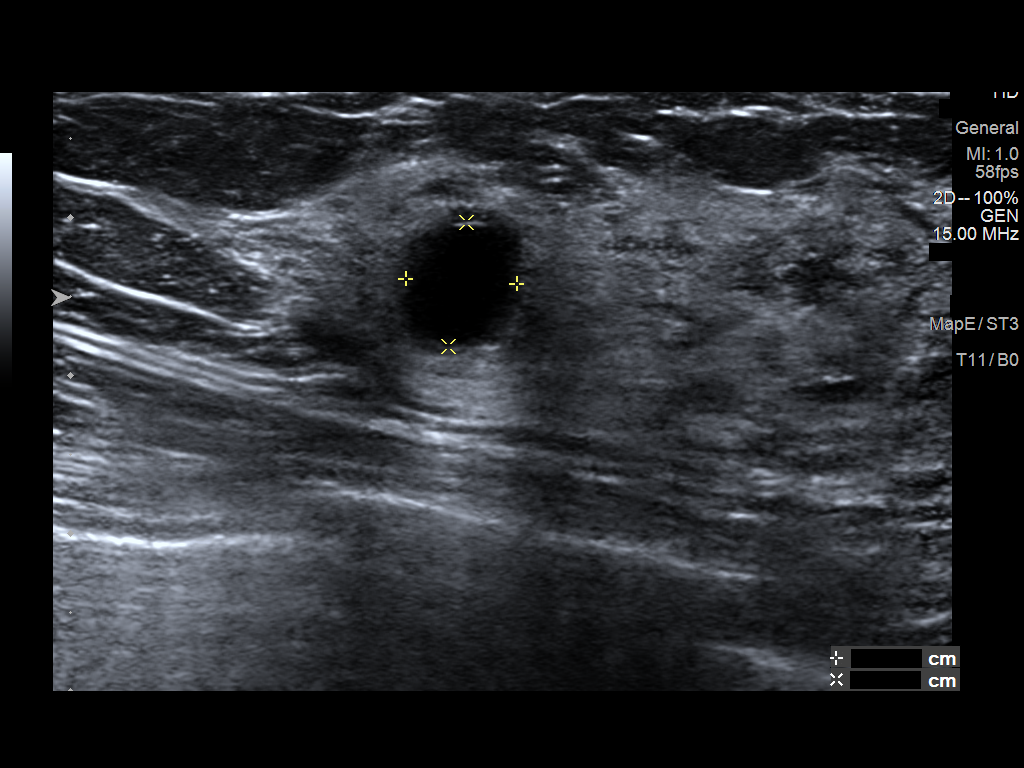
[im 3/6]
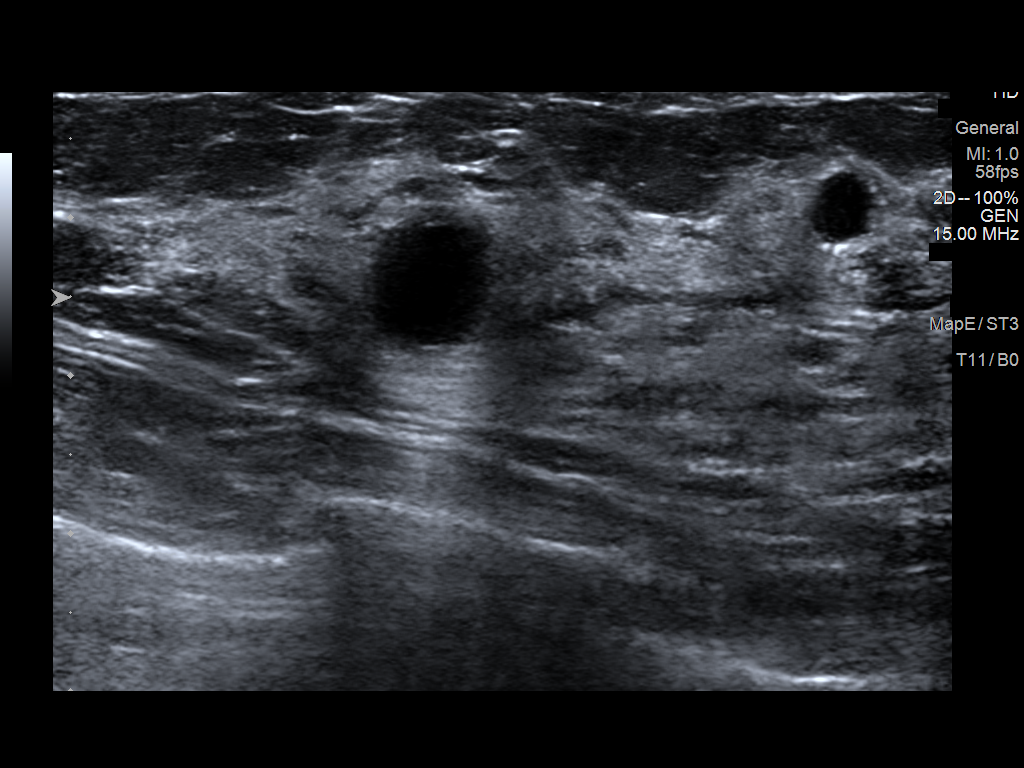
[im 4/6]
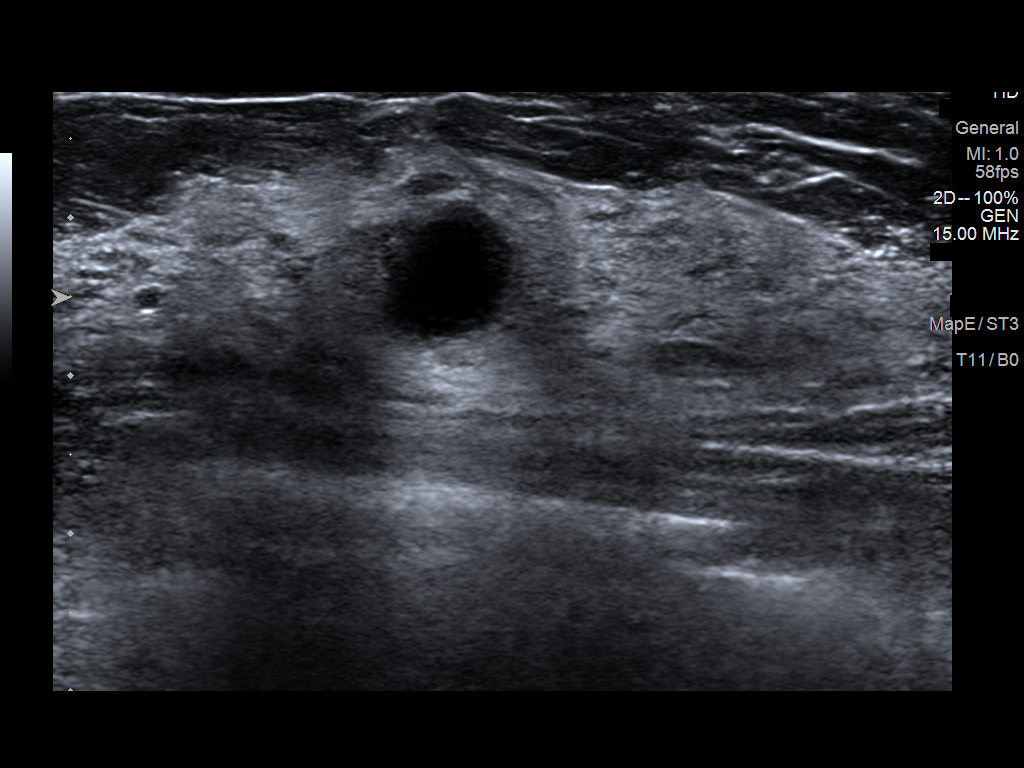
[im 5/6]
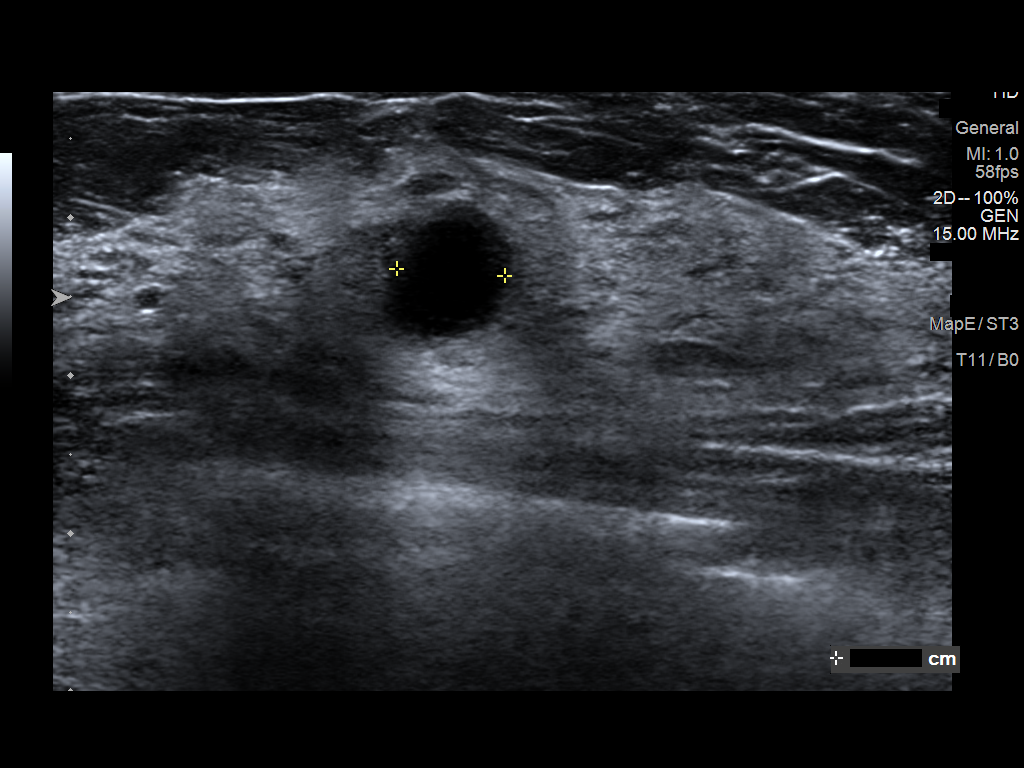
[im 6/6]
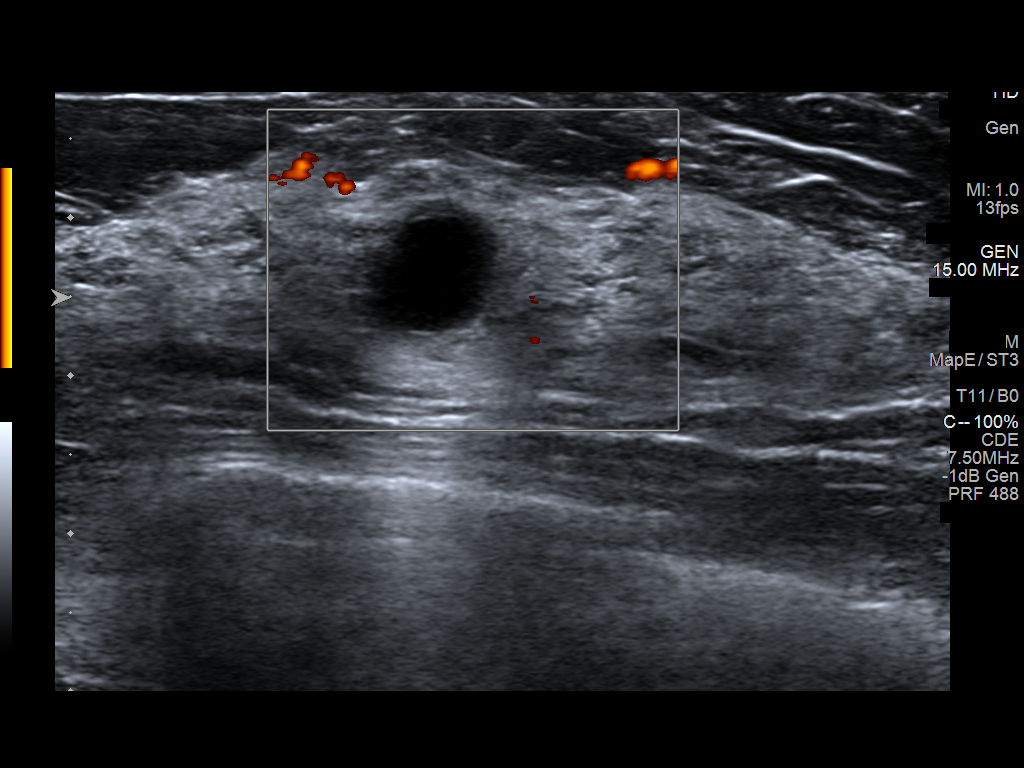

[6 of 6 positions shown; findings below may reference images not displayed]

ACR Breast Density Category c: The breast tissue is heterogeneously
dense, which may obscure small masses.
FINDINGS: Questionable small oval masses noted in the right breast, anterior
upper outer quadrant, corresponding to the palpable abnormality.
This appears stable from prior mammograms.

There are no areas of architectural distortion. No suspicious
calcifications.

No mammographic change.

Mammographic images were processed with CAD.

On physical exam, there is a small smooth mass in the upper right
breast.

Targeted ultrasound is performed, showing a round cyst in the right
breast at 12 o'clock, 3 cm the nipple, measuring 8 x 7 x 7 mm,
corresponding to the palpable abnormality. There is a smaller simple
cyst adjacent to this. No solid masses or suspicious lesions.
IMPRESSION: 1. No evidence of breast malignancy.
2. Benign right breast cysts.

RECOMMENDATION:
Screening mammogram in December 2018.(Code:A4-G-QHD)

I have discussed the findings and recommendations with the patient.
Results were also provided in writing at the conclusion of the
visit. If applicable, a reminder letter will be sent to the patient
regarding the next appointment.

BI-RADS CATEGORY  2: Benign.

## 2020-07-28 IMAGING — MG DIGITAL DIAGNOSTIC UNILATERAL RIGHT MAMMOGRAM WITH TOMO AND CAD
6 series · 6 of 18 positions shown · non-contrast
Comparison: Prior exams

CLINICAL DATA: Patient presents with a palpable lump in the upper
right breast.

EXAM:
DIGITAL DIAGNOSTIC RIGHT MAMMOGRAM WITH CAD AND TOMO
ULTRASOUND RIGHT BREAST

[R TAN synth-2D]
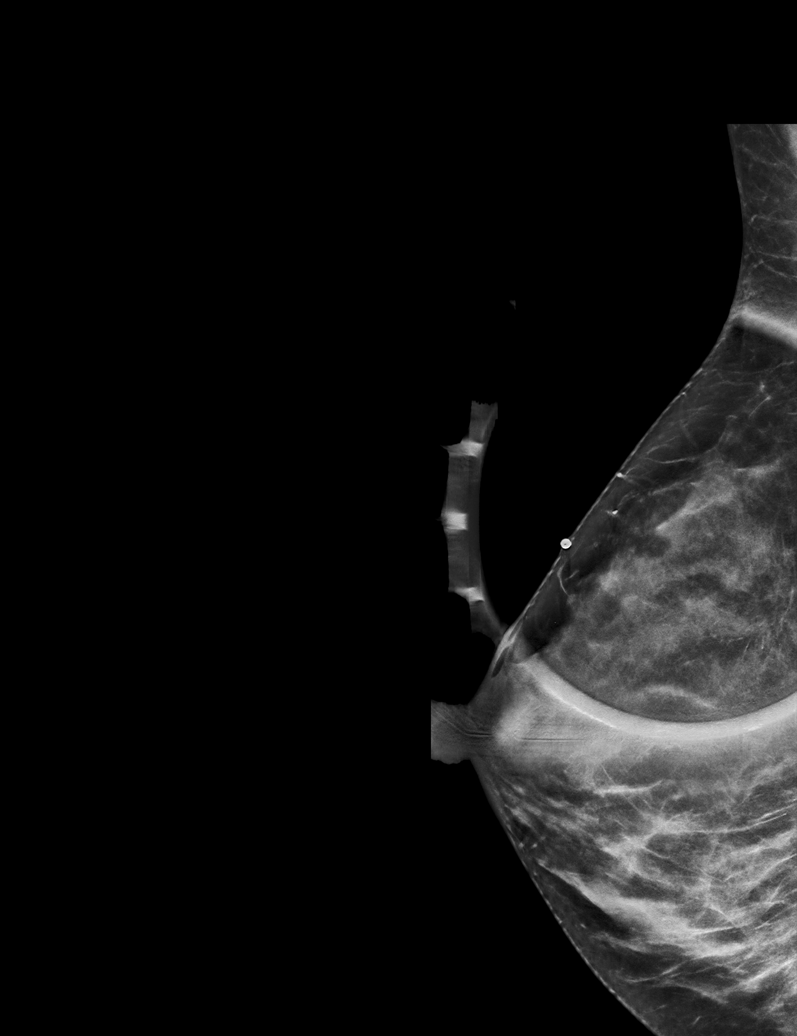

[R MLO synth-2D]
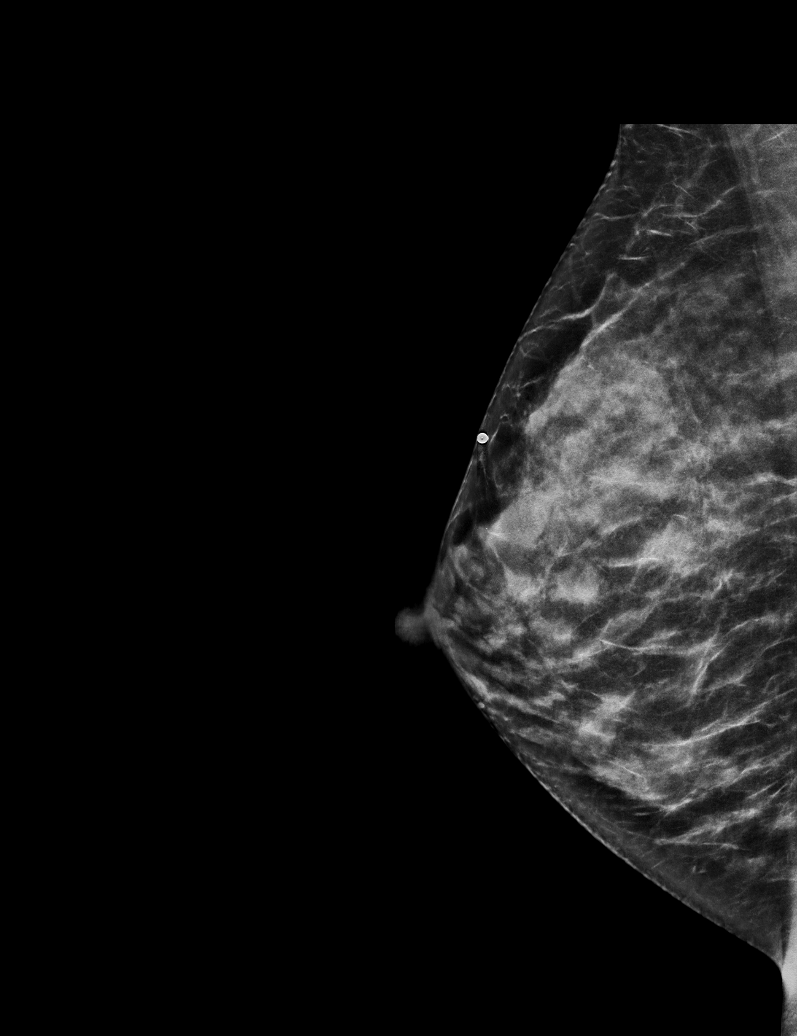

[R CC synth-2D]
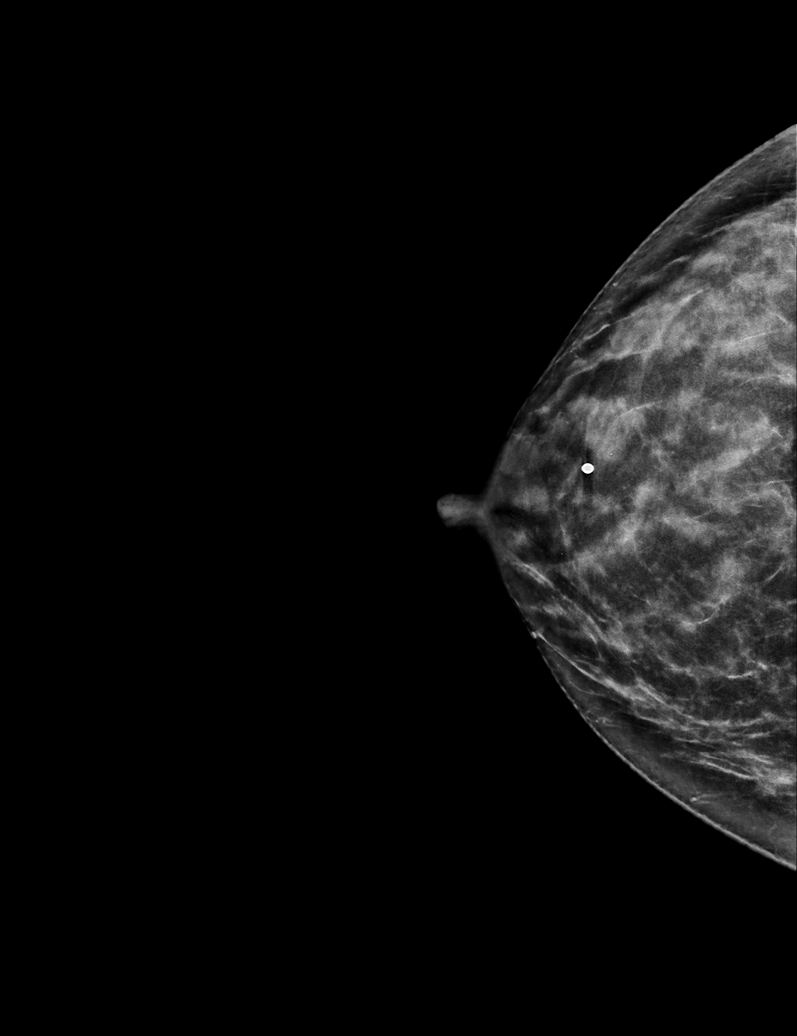

[R CC tomo · tomo slice 31/60.0]
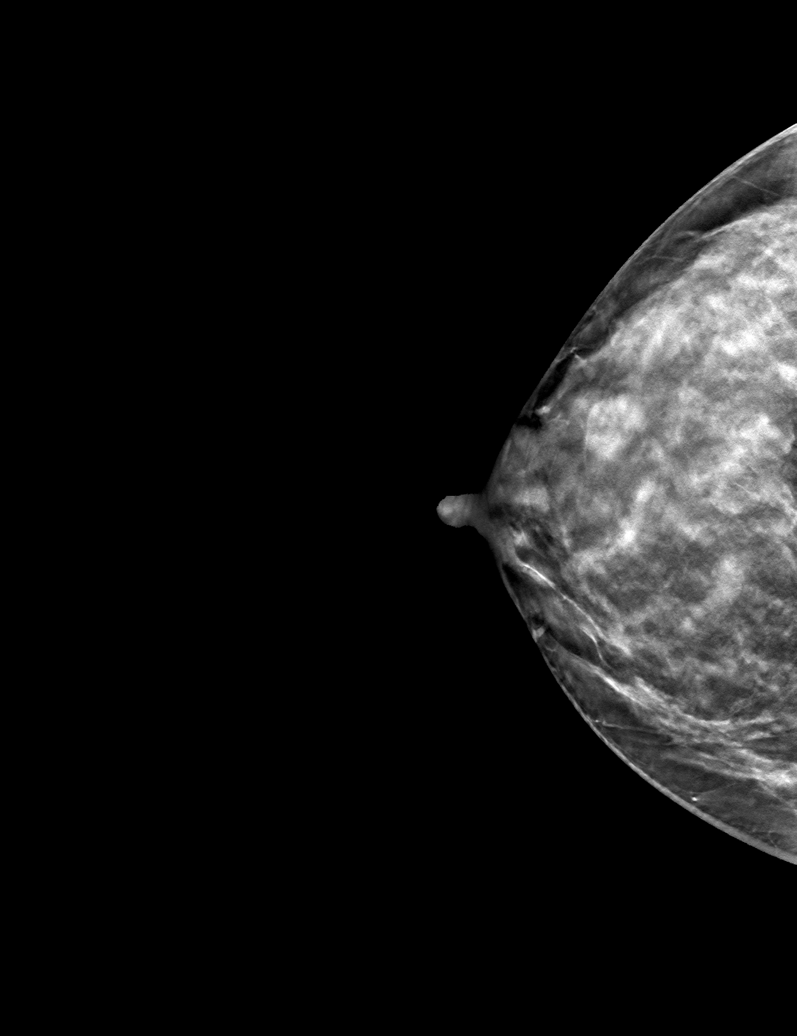

[R TAN tomo · tomo slice 27/53.0]
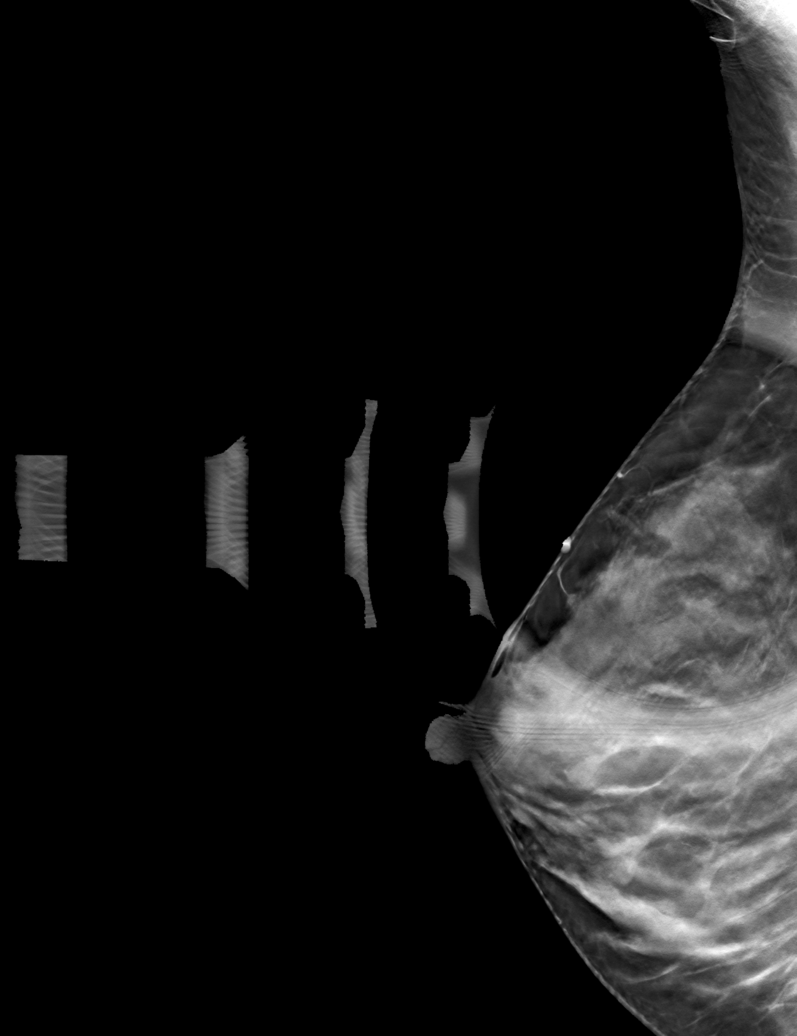

[R MLO tomo · tomo slice 29/58.0]
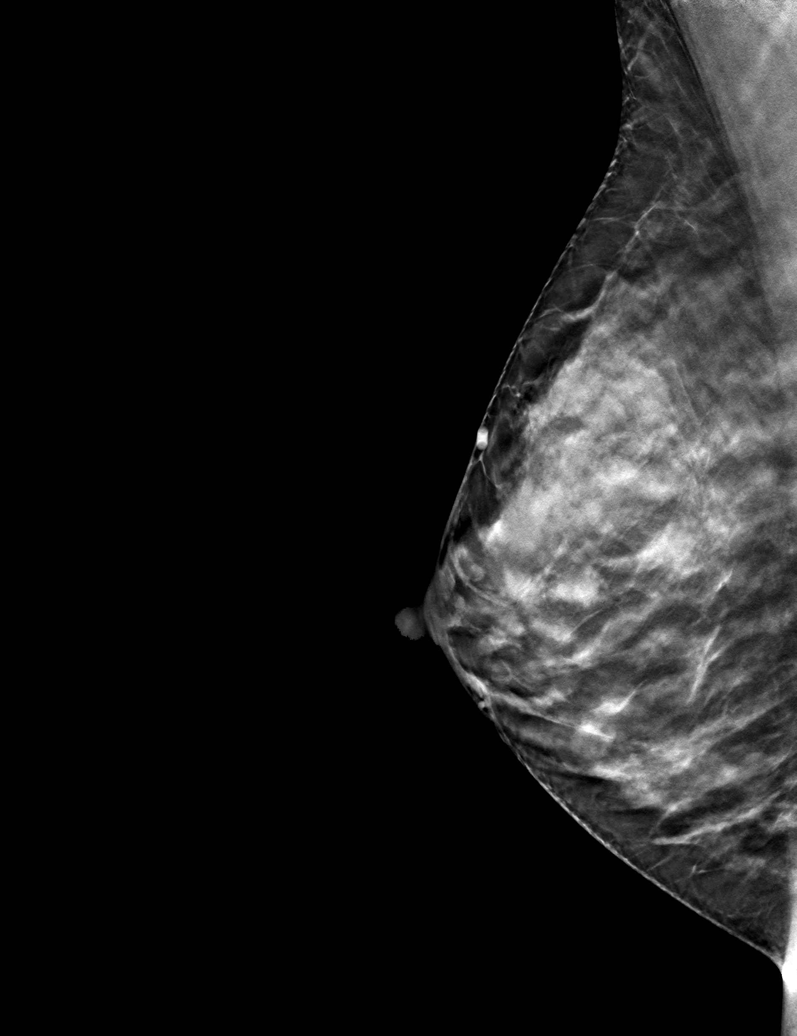

[6 of 18 positions shown; findings below may reference images not displayed]

ACR Breast Density Category c: The breast tissue is heterogeneously
dense, which may obscure small masses.
FINDINGS: Questionable small oval masses noted in the right breast, anterior
upper outer quadrant, corresponding to the palpable abnormality.
This appears stable from prior mammograms.

There are no areas of architectural distortion. No suspicious
calcifications.

No mammographic change.

Mammographic images were processed with CAD.

On physical exam, there is a small smooth mass in the upper right
breast.

Targeted ultrasound is performed, showing a round cyst in the right
breast at 12 o'clock, 3 cm the nipple, measuring 8 x 7 x 7 mm,
corresponding to the palpable abnormality. There is a smaller simple
cyst adjacent to this. No solid masses or suspicious lesions.
IMPRESSION: 1. No evidence of breast malignancy.
2. Benign right breast cysts.

RECOMMENDATION:
Screening mammogram in December 2018.(Code:A4-G-QHD)

I have discussed the findings and recommendations with the patient.
Results were also provided in writing at the conclusion of the
visit. If applicable, a reminder letter will be sent to the patient
regarding the next appointment.

BI-RADS CATEGORY  2: Benign.

## 2020-09-06 ENCOUNTER — Ambulatory Visit: Payer: Self-pay | Attending: Internal Medicine

## 2020-09-06 DIAGNOSIS — Z23 Encounter for immunization: Secondary | ICD-10-CM

## 2020-09-06 NOTE — Progress Notes (Signed)
   Covid-19 Vaccination [facility]  Name:  Roger Fasnacht    MRN: [id number] DOB: [birth year]  09/06/2020  Ms. Quesnel was observed post Covid-19 immunization for 15 minutes without incident. She was provided with Vaccine Information Sheet and instruction to access the V-Safe system.   Ms. Staff was instructed to call 911 with any severe reactions post vaccine: Marland Kitchen Difficulty breathing  . Swelling of face and throat  . A fast heartbeat  . A bad rash all over body  . Dizziness and weakness   Immunizations Administered    No immunizations on file.

## 2020-11-21 ENCOUNTER — Encounter: Payer: Self-pay | Admitting: Family Medicine

## 2020-11-21 ENCOUNTER — Ambulatory Visit (INDEPENDENT_AMBULATORY_CARE_PROVIDER_SITE_OTHER): Payer: 59 | Admitting: Family Medicine

## 2020-11-21 ENCOUNTER — Other Ambulatory Visit: Payer: Self-pay

## 2020-11-21 VITALS — BP 120/80 | HR 80 | Ht 68.0 in | Wt 156.8 lb

## 2020-11-21 DIAGNOSIS — J3489 Other specified disorders of nose and nasal sinuses: Secondary | ICD-10-CM | POA: Diagnosis not present

## 2020-11-21 DIAGNOSIS — R42 Dizziness and giddiness: Secondary | ICD-10-CM

## 2020-11-21 NOTE — Patient Instructions (Signed)
  Drink plenty of water. Take claritin daily. Use sudafed if needed for sinus pain. You may use sinus rinses, if needed as well. You can take meclizine (found in [place], Dramamine-N) if needed for more severe vertigo. If you develop fever, discolored mucus, or other new symptoms, please let us know.

## 2020-11-21 NOTE — Progress Notes (Signed)
Chief Complaint  Patient presents with  . Dizziness    Woke up with vertigo about 2 weeks ago. Feels off balance from time to time. Feels a weird tingling on her scalp. Over the weekend her face began to hurt and more pressure in her head when she bends over. Teeth hurt somewhat. Frontal HA. When she blows her nose her ears are cracking and opening.    A couple of weeks ago she had a weird sensation at the crown of her scalp, sensitive, also had some mild facial congestion. Equilibrium felt off that morning (mild vertigo).  Resolved later that day. Negative COVID test 2/4. Recurred the next week, felt like equilibrium was off. Also notices when leaning forward with the dishwasher, not lightheaded. Mild nasal congestion has persisted. Has some PND. Denies nasal stuffiness.  Today she has pain in her cheeks, forehead, and temples. Didn't have this last week when appointment was made. Took advil once or twice, helped some. Mucus is clear.  Some sneezing. Ears popped, had pressure with blowing her nose.  She had allergies over the summer, flonase was very helpful.  Stopped it once it resolved.  That felt different--she couldn't breathe from her nose, had a lot of nasal congestion that she isn't having now.  Also not having any itchy or watery eyes currently.  No sick contacts  PMH, PSH, SH reviewed   Outpatient Encounter Medications as of 11/21/2020  Medication Sig Note  . cholecalciferol (VITAMIN D) 1000 units tablet Take 1,000 Units by mouth daily.   Marland Kitchen levonorgestrel (MIRENA) 20 MCG/24HR IUD 1 each by Intrauterine route once.   . Probiotic Product (PROBIOTIC DAILY PO) Take 1 capsule by mouth daily. 08/28/2018: Metagenix UltraFlora IB  . [DISCONTINUED] fexofenadine (ALLEGRA) 180 MG tablet Take 180 mg by mouth daily.    No facility-administered encounter medications on file as of 11/21/2020.   Allergies  Allergen Reactions  . Bee Venom Anaphylaxis  . Sulfa Drugs Cross Reactors Hives    ROS:  No fever or chills, nausea, vomiting, diarrhea, urinary complaints No bleeding, bruising, rashes No sick contacts. Slight ringing in ears (mild/faint, bilateral). See HPI  PHYSICAL EXAM:  BP 120/80   Pulse 80   Ht 5\' 8"  (1.727 m)   Wt 156 lb 12.8 oz (71.1 kg)   BMI 23.84 kg/m   Well-appearing, pleasant female, in no distress. Doesn't sound congested, no throat clearing, coughing HEENT: conjunctiva and sclera are clear, EOMI.  Nasal mucosa has mild edema, L>R, with clear mucus bilaterally.  Mild tenderness to sinuses x 4.  OP is clear Neck: no lymphadenopathy or mass Heart: regular rate and rhythm Lungs: clear bilaterally Neuro: cranial nerves intact, normal strength, sensation, gait, DTR's. No vertigo or symptoms developed with position changes.   ASSESSMENT/PLAN:  Sinus pressure - no e/o bacterial infection. Rec decongestant, antihistamine, sinus rinses  Dysequilibrium - may be related to congestion (virus/cold vs allergies). no positional component. Treat with claritin, sudafed, meclizine prn vertigo  Discussed her prior h/o allergies--to resume Flonase at earliest onset of sx this Spring.    Drink plenty of water. Take claritin daily. Use sudafed if needed for sinus pain. You may use sinus rinses, if needed as well. You can take meclizine (found in [place], Dramamine-N) if needed for more severe vertigo. If you develop fever, discolored mucus, or other new symptoms, please let Laura Henderson know.

## 2021-05-19 LAB — RESULTS CONSOLE HPV: CHL HPV: NEGATIVE

## 2021-05-23 LAB — HM PAP SMEAR: HM Pap smear: NEGATIVE

## 2021-06-22 ENCOUNTER — Encounter: Payer: Self-pay | Admitting: Family Medicine

## 2021-06-22 ENCOUNTER — Ambulatory Visit (INDEPENDENT_AMBULATORY_CARE_PROVIDER_SITE_OTHER): Payer: 59 | Admitting: Family Medicine

## 2021-06-22 ENCOUNTER — Other Ambulatory Visit: Payer: Self-pay

## 2021-06-22 VITALS — BP 120/80 | HR 84 | Ht 68.0 in | Wt 157.8 lb

## 2021-06-22 DIAGNOSIS — Z23 Encounter for immunization: Secondary | ICD-10-CM | POA: Diagnosis not present

## 2021-06-22 DIAGNOSIS — R0789 Other chest pain: Secondary | ICD-10-CM

## 2021-06-22 DIAGNOSIS — R1012 Left upper quadrant pain: Secondary | ICD-10-CM

## 2021-06-22 LAB — POCT URINALYSIS DIP (PROADVANTAGE DEVICE)
Bilirubin, UA: NEGATIVE
Glucose, UA: NEGATIVE mg/dL
Ketones, POC UA: NEGATIVE mg/dL
Leukocytes, UA: NEGATIVE
Nitrite, UA: NEGATIVE
Protein Ur, POC: NEGATIVE mg/dL
Specific Gravity, Urine: 1.01
Urobilinogen, Ur: NEGATIVE
pH, UA: 6 (ref 5.0–8.0)

## 2021-06-22 NOTE — Progress Notes (Signed)
Chief Complaint  Patient presents with   Abdominal Pain    Left sided pain under lower rib area x 2 -3 days. 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 Started with her back and now front. Used heating pad on back-that is now better. Ibuprofen helped with abd pain.     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 There was a lot of twisting with the lifting. Tuesday had some back pain on the left side near her ribs.  She rolled it, stretched, used a heating pad, and pain improved. Yesterday the back pain improved, but started noticing more pain on the L side of the upper abdomen, lower ribs.  It hurts when she bends that side (crunches it, bending towards the left).  No pain with stretching or straightening out. Notices more discomfort when slouching than when sitting straight.  Yesterday she took Advil (mainly for her back), which dulled it.. Only took one dose  She denies numbness, tingling, radiation of pain into lower abdomen or lower extremity.  Denies weakness.  No urinary complaints Has IUD with chronic spotting.  Had recent US, everything okay.  Spotting started again this morning. (Not wearing tampon, very light)  PMH, PSH, SH reviewed  Outpatient Encounter Medications as of 06/22/2021  Medication Sig Note   cholecalciferol (VITAMIN D) 1000 units tablet Take 1,000 Units by mouth daily. 06/22/2021: 4-5 times a week   Ibuprofen 200 MG CAPS Take 200 mg by mouth as needed.    levonorgestrel (MIRENA) 20 MCG/24HR IUD 1 each by Intrauterine route once.    Probiotic Product (PROBIOTIC DAILY PO) Take 1 capsule by mouth daily. 08/28/2018: Metagenix UltraFlora IB   No facility-administered encounter medications on file as of 06/22/2021.   Allergies  Allergen Reactions   Bee Venom Anaphylaxis   Sulfa Drugs Cross Reactors Hives   ROS: Denies fever, chills, URI symptoms,  headaches, dizziness, shortness of breath, chest pain.  Denies nausea, vomiting, bowel changes, urinary complaints, bleeding, bruising, rash. Vaginal spotting per HPI.  Positional pain at L upper abd/lower ribs, and some very mild residual discomfort in L back, per HPI.    PHYSICAL EXAM:  BP 120/80   Pulse 84   Ht 5\' 8"  (1.727 m)   Wt 157 lb 12.8 oz (71.6 kg)   BMI 23.99 kg/m   Pleasant, well-appearing female in no distress HEENT: conjunctiva and sclera are clear, EOMI. Neck: no lymphadenopathy, thyromegaly or mass Heart: regular rate and rhythm Lungs: clear bilaterally Back: no spinal or CVA tenderness.  Area of discomfort is lateral mid-back. Nontender to palpation, no muscle spasm. Abdomen: soft, nontender, no organomegaly or mass Chest: nontender at sternum, costochondral junctions.  Nontender over floating ribs.  She has pain at the inferiormost ribs on the left only with certain positions (flexion of spine/slouching, and leaning to left). No focal tenderness  Urine dip: mod blood, otherwise negative   ASSESSMENT/PLAN:  Chest wall pain - musculoskeletal, with pain at inferior ribs in only certain positions. Likely related to overuse/change in activity. Heat, NSAIDS. Ddx reviewed  Left upper quadrant abdominal pain - abdomen nontender, think pain is musculoskeletal. Blood in urine likely related to vaginal spotting, not renal/bladder issue. To contact if sx change - Plan: POCT Urinalysis DIP (Proadvantage Device)  Need for influenza vaccination - Plan: Flu Vaccine QUAD 6+ mos PF IM (Fluarix Quad PF)  Switch  to Aleve, taking 1-2 tablets twice daily with food.  Use it twice daily until pain is resolved. Try and avoid the twisting. Remember to bending/lift from the legs, not the waist.  If you develop a rash (blisters over the painful area), this could indicate shingles and please reach out to [place] for a prescription (Valtrex).

## 2021-06-22 NOTE — Patient Instructions (Signed)
Switch to Aleve, taking 1-2 tablets twice daily with food.  Use it twice daily until pain is resolved. Try and avoid the twisting. Remember to bending/lift from the legs, not the waist.  If you develop a rash (blisters over the painful area), this could indicate shingles and please reach out to [place] for a prescription (Valtrex).

## 2022-05-22 DIAGNOSIS — Z6824 Body mass index (BMI) 24.0-24.9, adult: Secondary | ICD-10-CM | POA: Diagnosis not present

## 2022-05-22 DIAGNOSIS — Z01419 Encounter for gynecological examination (general) (routine) without abnormal findings: Secondary | ICD-10-CM | POA: Diagnosis not present

## 2022-05-22 DIAGNOSIS — Z1231 Encounter for screening mammogram for malignant neoplasm of breast: Secondary | ICD-10-CM | POA: Diagnosis not present

## 2022-05-24 ENCOUNTER — Other Ambulatory Visit: Payer: Self-pay | Admitting: Obstetrics and Gynecology

## 2022-05-24 DIAGNOSIS — R928 Other abnormal and inconclusive findings on diagnostic imaging of breast: Secondary | ICD-10-CM

## 2022-05-31 ENCOUNTER — Ambulatory Visit
Admission: RE | Admit: 2022-05-31 | Discharge: 2022-05-31 | Disposition: A | Discharge status: Home / Self Care | Payer: 59 | Source: Ambulatory Visit | Attending: Obstetrics and Gynecology | Admitting: Obstetrics and Gynecology

## 2022-05-31 DIAGNOSIS — R921 Mammographic calcification found on diagnostic imaging of breast: Secondary | ICD-10-CM | POA: Diagnosis not present

## 2022-05-31 DIAGNOSIS — R928 Other abnormal and inconclusive findings on diagnostic imaging of breast: Secondary | ICD-10-CM

## 2022-06-13 ENCOUNTER — Encounter: Payer: Self-pay | Admitting: Internal Medicine

## 2022-06-15 ENCOUNTER — Other Ambulatory Visit: Payer: Self-pay | Admitting: Obstetrics and Gynecology

## 2022-06-15 DIAGNOSIS — R921 Mammographic calcification found on diagnostic imaging of breast: Secondary | ICD-10-CM

## 2022-06-28 ENCOUNTER — Ambulatory Visit
Admission: RE | Admit: 2022-06-28 | Discharge: 2022-06-28 | Disposition: A | Discharge status: Home / Self Care | Payer: 59 | Source: Ambulatory Visit | Attending: Obstetrics and Gynecology | Admitting: Obstetrics and Gynecology

## 2022-06-28 DIAGNOSIS — R921 Mammographic calcification found on diagnostic imaging of breast: Secondary | ICD-10-CM | POA: Diagnosis not present

## 2022-06-28 DIAGNOSIS — N6011 Diffuse cystic mastopathy of right breast: Secondary | ICD-10-CM | POA: Diagnosis not present

## 2022-07-17 ENCOUNTER — Encounter: Payer: Self-pay | Admitting: Internal Medicine

## 2022-07-24 DIAGNOSIS — L565 Disseminated superficial actinic porokeratosis (DSAP): Secondary | ICD-10-CM | POA: Diagnosis not present

## 2022-07-24 DIAGNOSIS — D2262 Melanocytic nevi of left upper limb, including shoulder: Secondary | ICD-10-CM | POA: Diagnosis not present

## 2022-07-24 DIAGNOSIS — L570 Actinic keratosis: Secondary | ICD-10-CM | POA: Diagnosis not present

## 2022-07-24 DIAGNOSIS — D2261 Melanocytic nevi of right upper limb, including shoulder: Secondary | ICD-10-CM | POA: Diagnosis not present

## 2022-09-05 DIAGNOSIS — N760 Acute vaginitis: Secondary | ICD-10-CM | POA: Diagnosis not present

## 2022-10-24 DIAGNOSIS — L292 Pruritus vulvae: Secondary | ICD-10-CM | POA: Diagnosis not present

## 2022-11-15 ENCOUNTER — Other Ambulatory Visit: Payer: Self-pay | Admitting: Obstetrics and Gynecology

## 2022-11-15 DIAGNOSIS — R921 Mammographic calcification found on diagnostic imaging of breast: Secondary | ICD-10-CM

## 2023-01-07 ENCOUNTER — Other Ambulatory Visit: Payer: Self-pay | Admitting: Obstetrics and Gynecology

## 2023-01-07 ENCOUNTER — Ambulatory Visit
Admission: RE | Admit: 2023-01-07 | Discharge: 2023-01-07 | Disposition: A | Discharge status: Home / Self Care | Payer: 59 | Source: Ambulatory Visit | Attending: Obstetrics and Gynecology | Admitting: Obstetrics and Gynecology

## 2023-01-07 DIAGNOSIS — N6489 Other specified disorders of breast: Secondary | ICD-10-CM | POA: Diagnosis not present

## 2023-01-07 DIAGNOSIS — R921 Mammographic calcification found on diagnostic imaging of breast: Secondary | ICD-10-CM

## 2023-01-07 LAB — HM MAMMOGRAPHY

## 2023-01-30 ENCOUNTER — Other Ambulatory Visit: Payer: Self-pay | Admitting: Obstetrics and Gynecology

## 2023-01-30 DIAGNOSIS — R921 Mammographic calcification found on diagnostic imaging of breast: Secondary | ICD-10-CM

## 2023-02-19 DIAGNOSIS — J3489 Other specified disorders of nose and nasal sinuses: Secondary | ICD-10-CM | POA: Diagnosis not present

## 2023-02-19 DIAGNOSIS — H9203 Otalgia, bilateral: Secondary | ICD-10-CM | POA: Diagnosis not present

## 2023-02-19 DIAGNOSIS — R0981 Nasal congestion: Secondary | ICD-10-CM | POA: Diagnosis not present

## 2023-02-19 DIAGNOSIS — B9689 Other specified bacterial agents as the cause of diseases classified elsewhere: Secondary | ICD-10-CM | POA: Diagnosis not present

## 2023-02-19 DIAGNOSIS — R519 Headache, unspecified: Secondary | ICD-10-CM | POA: Diagnosis not present

## 2023-02-20 ENCOUNTER — Ambulatory Visit: Payer: 59 | Admitting: Family Medicine

## 2023-03-05 DIAGNOSIS — H1013 Acute atopic conjunctivitis, bilateral: Secondary | ICD-10-CM | POA: Diagnosis not present

## 2023-03-05 DIAGNOSIS — J309 Allergic rhinitis, unspecified: Secondary | ICD-10-CM | POA: Diagnosis not present

## 2023-03-05 DIAGNOSIS — J069 Acute upper respiratory infection, unspecified: Secondary | ICD-10-CM | POA: Diagnosis not present

## 2023-03-11 ENCOUNTER — Ambulatory Visit (INDEPENDENT_AMBULATORY_CARE_PROVIDER_SITE_OTHER): Payer: 59 | Admitting: Family Medicine

## 2023-03-11 ENCOUNTER — Encounter: Payer: Self-pay | Admitting: Family Medicine

## 2023-03-11 VITALS — BP 110/72 | HR 64 | Temp 97.8°F | Ht 68.0 in | Wt 160.4 lb

## 2023-03-11 DIAGNOSIS — J302 Other seasonal allergic rhinitis: Secondary | ICD-10-CM | POA: Diagnosis not present

## 2023-03-11 DIAGNOSIS — H1033 Unspecified acute conjunctivitis, bilateral: Secondary | ICD-10-CM

## 2023-03-11 NOTE — Progress Notes (Signed)
Chief Complaint  Patient presents with   Allergies    Saw UC 5/14 and 5/28. Main reason she went back was because the white's of her eyes are bright red. She was told it's an allergy. Not wearing contacts. Really wants your opinion. Cough has gotten better, has some drainage. I will get records from Dr. Renaldo Fiddler and she will schedule CPE with [place].     Went to [facility] 5/14.  At that time she had a month of symptoms--started with cold symptoms, initially got better, then worsened. She was prescribed Augmentin for acute sinusitis, as well as phenergan DM syrup and astelin nasal spray. She states things never got completely better.  Facial pressure resolved, but developed thick PND and cough. She has persistent cough for over a week (with some stress incontinence). The cough has improved, thought she was getting better. Traveled a lot, so ended up going to UC again (prior to going out of town). Woke up 5/28 with the whites of her eyes very red. Eyes feel dry, slightly watery.  Not painful or itchy. She went to UC, advised it was from allergies.  She was started on allegra, flonase, Optivar drops.  She has been wearing her glasses all week instead of contacts. Yesterday eyes looked much better.  Wore her contacts last night, and woke up with them bright red again today.  Eyes water some, never crust shut.  No itchy eyes.  She had done sinus rinses twice daily when more congested, backed down to once daily . Mucus is not discolored.  Using sudafed 12 hour once a day in the morning.  Initially she had some discomfort in both ears.  Now she had just R sided ear discomfort/pressure and sore throat.   PMH, PSH, SH reviewed  Outpatient Encounter Medications as of 03/11/2023  Medication Sig Note   azelastine (OPTIVAR) 0.05 % ophthalmic solution Place 1 drop into both eyes 2 (two) times daily.    cholecalciferol (VITAMIN D) 1000 units tablet Take 1,000 Units by mouth daily.    fexofenadine (ALLEGRA  ALLERGY) 180 MG tablet Take 180 mg by mouth daily.    fluticasone (FLONASE) 50 MCG/ACT nasal spray Place 1 spray into both nostrils daily.    levonorgestrel (MIRENA) 20 MCG/24HR IUD 1 each by Intrauterine route once.    Probiotic Product (PROBIOTIC DAILY PO) Take 1 capsule by mouth daily.    pseudoephedrine (SUDAFED) 120 MG 12 hr tablet Take 120 mg by mouth 2 (two) times daily. 03/11/2023: Taking once in the am   azelastine (ASTELIN) 0.1 % nasal spray Place 1 spray into both nostrils 2 (two) times daily. (Patient not taking: Reported on 03/11/2023) 03/11/2023: Stopped this when she started flonase   [DISCONTINUED] Ibuprofen 200 MG CAPS Take 200 mg by mouth as needed.    No facility-administered encounter medications on file as of 03/11/2023.    Allergies  Allergen Reactions   Bee Venom Anaphylaxis   Sulfa Drugs Cross Reactors Hives    ROS:  no fever, chills. No headaches or dizziness. No chest pain or shortness of breath. No n/v/d, no rashes, bleeding or bruising. See HPI for vision/ear/URI/allergy symptoms.   PHYSICAL EXAM:  BP 110/72   Pulse 64   Temp 97.8 F (36.6 C) (Tympanic)   Ht 5\' 8"  (1.727 m)   Wt 160 lb 6.4 oz (72.8 kg)   BMI 24.39 kg/m   Wt Readings from Last 3 Encounters:  03/11/23 160 lb 6.4 oz (72.8 kg)  06/22/21 157  lb 12.8 oz (71.6 kg)  11/21/20 156 lb 12.8 oz (71.1 kg)   Well-appearing, pleasant female, sounds slightly congested, in no distress.  No coughing or throat-clearing during visit HEENT: conjunctiva is moderately congested bilaterally, symmetric. FROM, fundi benign. Eyes are not watering, no crusting.  No swelling of lids, no erythema. TM's and EACs are normal bilaterally. OP is clear.  Some erythema posterior OP on the right. Tonsils are normal. Nasal mucosa was moderately edematous with clear mucus noted bilaterally.  Sinuses were nontender Neck: no lymphadenopathy or mass Heart: regular rate and rhythm Lungs: clear bilaterally Psych: normal mood,  affect, hygiene and grooming Neuro: alert and oriented, cranial nerves grossly intact. Normal gait   ASSESSMENT/PLAN:  Acute conjunctivitis of both eyes, unspecified acute conjunctivitis type - Ddx discussed, suspect allergic given other symptoms.  No e/o viral/bacterial conjunctivitis.  f/u with ophtho if recommended treatments aren't helping  Seasonal allergies - increase flonase dose to 2 sprays/nostril, technique reviewed; cont antihistamine, sudafed prn. Mucinex prn. no e/o bacterial infection.  Continue daily antihistamine (feel free to switch them up to see if one works better than other--claritin, zyrtec, allegra). You can continue the sudafed if needed for congestion. Increase your flonase to 2 sprays to each nostril, being sure to take gentle sniffs. Add in a lubricating eye drop (Systane or Natural Tears).  Continue sinus rinses as needed. Restart mucinex if you notice thicker mucus, more throat-clearing or sinus pressure.  Consider trying different drops to see if one might be more effective than the Optivar (ie over-the-counter Naphcon-A, or Patanol, Zaditor (these used to be prescription only).  Follow up with your eye doctor if the redness isn't improving.   GYN records to help close gaps. Schedule CPE

## 2023-03-11 NOTE — Patient Instructions (Signed)
Continue daily antihistamine (feel free to switch them up to see if one works better than other--claritin, zyrtec, allegra). You can continue the sudafed if needed for congestion. Increase your flonase to 2 sprays to each nostril, being sure to take gentle sniffs. Add in a lubricating eye drop (Systane or Natural Tears).  Continue sinus rinses as needed. Restart mucinex if you notice thicker mucus, more throat-clearing or sinus pressure.  Consider trying different drops to see if one might be more effective than the Optivar (ie over-the-counter Naphcon-A, or Patanol, Zaditor (these used to be prescription only).  Follow up with your eye doctor if the redness isn't improving.

## 2023-03-18 ENCOUNTER — Encounter: Payer: Self-pay | Admitting: *Deleted

## 2023-03-18 DIAGNOSIS — H1013 Acute atopic conjunctivitis, bilateral: Secondary | ICD-10-CM | POA: Diagnosis not present

## 2023-03-18 DIAGNOSIS — T1501XA Foreign body in cornea, right eye, initial encounter: Secondary | ICD-10-CM | POA: Diagnosis not present

## 2023-03-21 ENCOUNTER — Encounter: Payer: Self-pay | Admitting: Family Medicine

## 2023-03-21 ENCOUNTER — Ambulatory Visit (INDEPENDENT_AMBULATORY_CARE_PROVIDER_SITE_OTHER): Payer: 59 | Admitting: Family Medicine

## 2023-03-21 VITALS — BP 110/60 | HR 68 | Temp 97.5°F | Ht 68.0 in | Wt 159.8 lb

## 2023-03-21 DIAGNOSIS — R21 Rash and other nonspecific skin eruption: Secondary | ICD-10-CM | POA: Diagnosis not present

## 2023-03-21 NOTE — Progress Notes (Signed)
Chief Complaint  Patient presents with   Rash    Rash on back of left upper thigh that she woke up with this morning, somewhat itchy. Her ophthalmologist started her on doxy 100mg  BID on Monday, no other changes.    She woke up today with a rash on the upper left thigh.  It was a little itchy. She had some bug bites elsewhere that were itchy, but this felt different, no bites, and felt rough in texture. She reports this area is nontender No known bites, ticks. She wasn't out much yesterday, no sun exposure. She used hydrocortisone cream this morning, and it helped. Less itchy  Going to [facility] on Saturday.  Saw her eye doctor earlier this week.  She prescribed doxycycline and steroid eye drops, due to having a lot of inflammation. She also reportedly pulled out a foreign body from R eye, and is now also using an ointment nightly for that. Got very red twice after wearing contacts, so is now only wearing glasses. She hasn't worn contacts, and has done very well.  She has f/u later this week with ophtho.   PMH, PSH, SH reviewed  Outpatient Encounter Medications as of 03/21/2023  Medication Sig Note   cholecalciferol (VITAMIN D) 1000 units tablet Take 1,000 Units by mouth daily.    erythromycin ophthalmic ointment Place 1 Application into the right eye daily.    fexofenadine (ALLEGRA ALLERGY) 180 MG tablet Take 180 mg by mouth daily.    fluticasone (FLONASE) 50 MCG/ACT nasal spray Place 1 spray into both nostrils daily.    levonorgestrel (MIRENA) 20 MCG/24HR IUD 1 each by Intrauterine route once.    prednisoLONE acetate (PRED FORTE) 1 % ophthalmic suspension Place 1 drop into both eyes 3 (three) times daily.    Probiotic Product (PROBIOTIC DAILY PO) Take 1 capsule by mouth daily.    doxycycline (VIBRAMYCIN) 100 MG capsule Take 100 mg by mouth 2 (two) times daily. (Patient not taking: Reported on 03/21/2023) 03/21/2023: Did not take this am   [DISCONTINUED] azelastine (ASTELIN) 0.1 % nasal spray  Place 1 spray into both nostrils 2 (two) times daily. (Patient not taking: Reported on 03/11/2023) 03/11/2023: Stopped this when she started flonase   [DISCONTINUED] azelastine (OPTIVAR) 0.05 % ophthalmic solution Place 1 drop into both eyes 2 (two) times daily.    [DISCONTINUED] pseudoephedrine (SUDAFED) 120 MG 12 hr tablet Take 120 mg by mouth 2 (two) times daily. 03/11/2023: Taking once in the am   No facility-administered encounter medications on file as of 03/21/2023.   Allergies  Allergen Reactions   Bee Venom Anaphylaxis   Sulfa Drugs Cross Reactors Hives    ROS: no fever, chills, URI symptoms, myalgias, arthralgias, headaches. Eye issues improving, per HPI. Rash per HPI.  No n/v/d, CP/SOB or other concerns.    PHYSICAL EXAM:  BP 110/60   Pulse 68   Temp (!) 97.5 F (36.4 C) (Tympanic)   Ht 5\' 8"  (1.727 m)   Wt 159 lb 12.8 oz (72.5 kg)   BMI 24.30 kg/m   Pleasant, well-appearing female in no distress HEENT: conjunctiva and sclera are clear.  Wearing glasses Skin:  Large area at the left upper thigh, with a few linear erythematous areas extending up to the buttock (along skin fold direction). These areas are erythematous, feel a little rough/dry.  No central clearing. No warmth, streaks.  L upper thigh--12 x 6 cm more confluent area of erythema. Just above this, is more sporadic, some areas more linear.  Total area is 10 x 4 of this upper portion  Remainder of skin is normal, no other rashes. Psych: normal mood, affect, hygiene and grooming Neuro: alert and oriented, cranial nerves grossly intact. Normal sensation, gait.   ASSESSMENT/PLAN:  Rash - L thigh. Ddx reviewed, suspect contact derm. If widely spready, consider ABX allergy. Disc potential sun issues with doxy. HC BID-TID prn

## 2023-03-21 NOTE — Patient Instructions (Addendum)
Continue to use hydrocortisone cream 2-3 times daily as needed for the itching and rash.  If the rash continues to spread throughout your body, this would be more indicative of an allergic reaction (likely to the antibiotic).  Be very careful in the sun (and religious about your sunscreen) while on the doxycycline.

## 2023-03-22 ENCOUNTER — Encounter: Payer: Self-pay | Admitting: Family Medicine

## 2023-03-22 DIAGNOSIS — H1013 Acute atopic conjunctivitis, bilateral: Secondary | ICD-10-CM | POA: Diagnosis not present

## 2023-03-22 DIAGNOSIS — T1501XA Foreign body in cornea, right eye, initial encounter: Secondary | ICD-10-CM | POA: Diagnosis not present

## 2023-03-22 DIAGNOSIS — H40053 Ocular hypertension, bilateral: Secondary | ICD-10-CM | POA: Diagnosis not present

## 2023-04-08 DIAGNOSIS — H40053 Ocular hypertension, bilateral: Secondary | ICD-10-CM | POA: Diagnosis not present

## 2023-04-08 DIAGNOSIS — D2262 Melanocytic nevi of left upper limb, including shoulder: Secondary | ICD-10-CM | POA: Diagnosis not present

## 2023-04-08 DIAGNOSIS — L700 Acne vulgaris: Secondary | ICD-10-CM | POA: Diagnosis not present

## 2023-04-08 DIAGNOSIS — H1013 Acute atopic conjunctivitis, bilateral: Secondary | ICD-10-CM | POA: Diagnosis not present

## 2023-04-08 DIAGNOSIS — L918 Other hypertrophic disorders of the skin: Secondary | ICD-10-CM | POA: Diagnosis not present

## 2023-04-08 DIAGNOSIS — D2261 Melanocytic nevi of right upper limb, including shoulder: Secondary | ICD-10-CM | POA: Diagnosis not present

## 2023-04-08 DIAGNOSIS — D485 Neoplasm of uncertain behavior of skin: Secondary | ICD-10-CM | POA: Diagnosis not present

## 2023-04-08 DIAGNOSIS — D2271 Melanocytic nevi of right lower limb, including hip: Secondary | ICD-10-CM | POA: Diagnosis not present

## 2023-04-08 DIAGNOSIS — D225 Melanocytic nevi of trunk: Secondary | ICD-10-CM | POA: Diagnosis not present

## 2023-04-08 DIAGNOSIS — D2272 Melanocytic nevi of left lower limb, including hip: Secondary | ICD-10-CM | POA: Diagnosis not present

## 2023-04-08 DIAGNOSIS — L565 Disseminated superficial actinic porokeratosis (DSAP): Secondary | ICD-10-CM | POA: Diagnosis not present

## 2023-04-17 ENCOUNTER — Encounter: Payer: Self-pay | Admitting: Internal Medicine

## 2023-05-11 NOTE — Patient Instructions (Incomplete)

## 2023-05-11 NOTE — Progress Notes (Unsigned)
No chief complaint on file.  Laura Henderson is a 47 y.o. female who presents for a complete physical.   She sees Dr. Renaldo Fiddler for GYN care  She has the following concerns:   Seasonal allergies:  controlled with allegra and flonase  Immunization History  Administered Date(s) Administered   Influenza Inj Mdck Quad Pf 07/30/2019   Influenza Split 07/29/2012   Influenza,inj,Quad PF,6+ Mos 07/27/2013, 08/01/2017, 06/22/2021   Influenza-Unspecified 07/26/2018, 08/22/2020   Moderna SARS-COV2 Booster Vaccination 09/06/2020   Moderna Sars-Covid-2 Vaccination 12/17/2019, 01/14/2020   Td 08/01/2017   Tdap 05/21/2005   Last Pap smear: 05/2021, normal, no high risk HPV Last mammogram: 01/2023 Last colonoscopy: 06/2018 Dr. Loreta Ave; hyperplastic/adenomatous polpys;duodenal bx-lymphocytosis  Last DEXA: never Dentist: Ophtho: Exercise:   Other Doctors: Derm--Dr. Amy [place] GYN: Dr. Renaldo Fiddler GI: Dr. Loreta Ave   PMH, PSH, [facility] and FH were reviewed and updated   ROS:  The patient denies anorexia, fever, weight changes, headaches,  vision changes, decreased hearing, ear pain, sore throat, breast concerns, chest pain, palpitations, dizziness, syncope, dyspnea on exertion, cough, swelling, nausea, vomiting, diarrhea, constipation, abdominal pain, melena, hematochezia, indigestion/heartburn, hematuria, incontinence, dysuria, irregular menstrual cycles, vaginal discharge, odor or itch, genital lesions, joint pains, numbness, tingling, weakness, tremor, suspicious skin lesions, depression, anxiety, abnormal bleeding/bruising, or enlarged lymph nodes.  UPDATE ALL   PHYSICAL EXAM:  There were no vitals taken for this visit.  Wt Readings from Last 3 Encounters:  03/21/23 159 lb 12.8 oz (72.5 kg)  03/11/23 160 lb 6.4 oz (72.8 kg)  06/22/21 157 lb 12.8 oz (71.6 kg)   There were no vitals taken for this visit.  General Appearance:    Alert, cooperative, no distress, appears stated age  Head:    Normocephalic,  without obvious abnormality, atraumatic  Eyes:    PERRL, conjunctiva/corneas clear, EOM's intact, fundi    benign  Ears:    Normal TM's and external ear canals  Nose:   Nares normal, mucosa normal, no drainage or sinus   tenderness  Throat:   Lips, mucosa, and tongue normal; teeth and gums normal  Neck:   Supple, no lymphadenopathy;  thyroid:  no enlargement/ tenderness/nodules; no carotid bruit or JVD  Back:    Spine nontender, no curvature, ROM normal, no CVA     tenderness  Lungs:     Clear to auscultation bilaterally without wheezes, rales or     ronchi; respirations unlabored  Chest Wall:    No tenderness or deformity   Heart:    Regular rate and rhythm, S1 and S2 normal, no murmur, rub   or gallop  Breast Exam:    Deferred to GYN  Abdomen:     Soft, non-tender, nondistended, normoactive bowel sounds,    no masses, no hepatosplenomegaly  Genitalia:    Deferred to GYN     Extremities:   No clubbing, cyanosis or edema  Pulses:   2+ and symmetric all extremities  Skin:   Skin color, texture, turgor normal, no rashes or lesions  Lymph nodes:   Cervical, supraclavicular, and axillary nodes normal  Neurologic:   CNII-XII intact, normal strength, sensation and gait; reflexes 2+ and symmetric throughout          Psych:   Normal mood, affect, hygiene and grooming.     ***UPDATE ALL OF CPE   ASSESSMENT/PLAN:  Enter if she got flu shot last year Tdap removed--she had Td in 2018  Colonoscopy due September with Dr. Loreta Ave?  Discuss labs/reasons (gaps), D  might not be covered, rec doing  Discussed monthly self breast exams and yearly mammograms; at least 30 minutes of aerobic activity at least 5 days/week, weight-bearing exercise at least 2x/week; proper sunscreen use reviewed; healthy diet, including goals of calcium and vitamin D intake and alcohol recommendations (less than or equal to 1 drink/day) reviewed; regular seatbelt use; changing batteries in smoke detectors.  Immunization  recommendations discussed--  yearly flu shots are recommended.  Updated COVID booster recommended when available in the Fall. Colonoscopy recommendations reviewed, due in September with Dr. Loreta Ave.

## 2023-05-13 ENCOUNTER — Encounter: Payer: Self-pay | Admitting: Family Medicine

## 2023-05-13 ENCOUNTER — Ambulatory Visit (INDEPENDENT_AMBULATORY_CARE_PROVIDER_SITE_OTHER): Payer: 59 | Admitting: Family Medicine

## 2023-05-13 VITALS — BP 110/68 | HR 80 | Ht 67.5 in | Wt 159.8 lb

## 2023-05-13 DIAGNOSIS — H04123 Dry eye syndrome of bilateral lacrimal glands: Secondary | ICD-10-CM | POA: Diagnosis not present

## 2023-05-13 DIAGNOSIS — Z0000 Encounter for general adult medical examination without abnormal findings: Secondary | ICD-10-CM

## 2023-05-13 DIAGNOSIS — Z1159 Encounter for screening for other viral diseases: Secondary | ICD-10-CM

## 2023-05-13 DIAGNOSIS — E559 Vitamin D deficiency, unspecified: Secondary | ICD-10-CM | POA: Diagnosis not present

## 2023-05-13 DIAGNOSIS — Z52000 Unspecified donor, whole blood: Secondary | ICD-10-CM

## 2023-05-13 DIAGNOSIS — J302 Other seasonal allergic rhinitis: Secondary | ICD-10-CM | POA: Diagnosis not present

## 2023-05-13 DIAGNOSIS — Z23 Encounter for immunization: Secondary | ICD-10-CM

## 2023-05-13 DIAGNOSIS — Z114 Encounter for screening for human immunodeficiency virus [HIV]: Secondary | ICD-10-CM

## 2023-05-13 LAB — POCT URINALYSIS DIP (PROADVANTAGE DEVICE)
Bilirubin, UA: NEGATIVE
Blood, UA: NEGATIVE
Glucose, UA: NEGATIVE mg/dL
Ketones, POC UA: NEGATIVE mg/dL
Leukocytes, UA: NEGATIVE
Nitrite, UA: NEGATIVE
Protein Ur, POC: NEGATIVE mg/dL
Specific Gravity, Urine: 1.005
Urobilinogen, Ur: 0.2
pH, UA: 7.5 (ref 5.0–8.0)

## 2023-06-20 DIAGNOSIS — Z01419 Encounter for gynecological examination (general) (routine) without abnormal findings: Secondary | ICD-10-CM | POA: Diagnosis not present

## 2023-06-20 DIAGNOSIS — Z6824 Body mass index (BMI) 24.0-24.9, adult: Secondary | ICD-10-CM | POA: Diagnosis not present

## 2023-06-25 DIAGNOSIS — Z1211 Encounter for screening for malignant neoplasm of colon: Secondary | ICD-10-CM | POA: Diagnosis not present

## 2023-06-25 DIAGNOSIS — Z86010 Personal history of colonic polyps: Secondary | ICD-10-CM | POA: Diagnosis not present

## 2023-07-03 ENCOUNTER — Other Ambulatory Visit: Payer: 59

## 2023-07-03 ENCOUNTER — Telehealth: Payer: Self-pay | Admitting: Family Medicine

## 2023-07-03 ENCOUNTER — Other Ambulatory Visit: Payer: Self-pay | Admitting: *Deleted

## 2023-07-03 DIAGNOSIS — Z111 Encounter for screening for respiratory tuberculosis: Secondary | ICD-10-CM | POA: Diagnosis not present

## 2023-07-03 NOTE — Telephone Encounter (Signed)
Left message for patient. Just wanted to let you know Shauna for when she comes in.

## 2023-07-03 NOTE — Telephone Encounter (Signed)
Pt is in need of a tb test for a new job is this ok to have and a form filled out she will drop it off later today

## 2023-07-03 NOTE — Telephone Encounter (Signed)
Ok to drop off form. Okay for quantiferon gold (pt needs to be advised that is how we do TB tests here, that we don't do the skin tests, and that results can take a week or so). Okay for future order and visit for this

## 2023-07-04 LAB — QUANTIFERON-TB GOLD PLUS: QuantiFERON-TB Gold Plus: NEGATIVE

## 2023-07-10 ENCOUNTER — Ambulatory Visit
Admission: RE | Admit: 2023-07-10 | Discharge: 2023-07-10 | Disposition: A | Discharge status: Home / Self Care | Payer: 59 | Source: Ambulatory Visit | Attending: Obstetrics and Gynecology | Admitting: Obstetrics and Gynecology

## 2023-07-10 DIAGNOSIS — R921 Mammographic calcification found on diagnostic imaging of breast: Secondary | ICD-10-CM

## 2023-08-05 DIAGNOSIS — D122 Benign neoplasm of ascending colon: Secondary | ICD-10-CM | POA: Diagnosis not present

## 2023-08-05 DIAGNOSIS — Z860102 Personal history of hyperplastic colon polyps: Secondary | ICD-10-CM | POA: Diagnosis not present

## 2023-08-05 DIAGNOSIS — Z86012 Personal history of benign carcinoid tumor: Secondary | ICD-10-CM | POA: Diagnosis not present

## 2023-08-05 DIAGNOSIS — Z1211 Encounter for screening for malignant neoplasm of colon: Secondary | ICD-10-CM | POA: Diagnosis not present

## 2023-08-05 DIAGNOSIS — K635 Polyp of colon: Secondary | ICD-10-CM | POA: Diagnosis not present

## 2023-08-05 DIAGNOSIS — Z860100 Personal history of colon polyps, unspecified: Secondary | ICD-10-CM | POA: Diagnosis not present

## 2023-08-05 DIAGNOSIS — Z860101 Personal history of adenomatous and serrated colon polyps: Secondary | ICD-10-CM | POA: Diagnosis not present

## 2023-08-05 LAB — HM COLONOSCOPY

## 2023-08-30 ENCOUNTER — Encounter: Payer: Self-pay | Admitting: *Deleted

## 2023-09-04 ENCOUNTER — Encounter: Payer: Self-pay | Admitting: *Deleted

## 2023-09-18 ENCOUNTER — Ambulatory Visit (INDEPENDENT_AMBULATORY_CARE_PROVIDER_SITE_OTHER): Payer: 59

## 2023-09-18 ENCOUNTER — Ambulatory Visit (INDEPENDENT_AMBULATORY_CARE_PROVIDER_SITE_OTHER): Payer: 59 | Admitting: Podiatry

## 2023-09-18 DIAGNOSIS — M79671 Pain in right foot: Secondary | ICD-10-CM

## 2023-09-18 DIAGNOSIS — M722 Plantar fascial fibromatosis: Secondary | ICD-10-CM | POA: Diagnosis not present

## 2023-09-18 MED ORDER — MELOXICAM 15 MG PO TABS: 15.0000 mg | ORAL_TABLET | Freq: Every day | ORAL | 0 refills | Status: DC

## 2023-09-18 NOTE — Patient Instructions (Signed)

## 2023-09-18 NOTE — Progress Notes (Signed)
  Subjective:  Patient ID: Laura Henderson, female    DOB: May 23, 1976,   MRN: [id number]  Chief Complaint  Patient presents with   Plantar Fasciitis    Pt presents for right heel pain that won't get better, pt stated the pain started when she was playing soccer with her daughter a while ago and it has gotten worse.    47 y.o. female presents for concern of right heel pain that has been ongoing for about 8 weeks. Relates she was playing soccer with her daughter and afterwards noticed the bottom of her foot was starting to hurt. Relates first steps in the morning are the most painful and after being on her feet for a while. Has tried some stetching and some arch supports with minimal relief.   . Denies any other pedal complaints. Denies n/v/f/c.   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 CFT <3 seconds. Normal hair growth on digits. No edema.  Skin. No lacerations or abrasions bilateral feet.  Musculoskeletal: MMT 5/5 bilateral lower extremities in DF, PF, Inversion and Eversion. Deceased ROM in DF of ankle joint minimally . Tender to the medial calcaneal tubercle right . No pain with achilles, PT or arch. No pain with calcaneal squeeze.  Neurological: Sensation intact to light touch.   Assessment:   1. Plantar fasciitis, right      Plan:  Patient was evaluated and treated and all questions answered. Discussed plantar fasciitis with patient.  X-rays reviewed and discussed with patient. No acute fractures or dislocations noted. Mild spurring noted at inferior calcaneus.  Discussed treatment options including, ice, NSAIDS, supportive shoes, bracing, and stretching. Stretching  exercises provided to be done on a daily basis.   Prescription for meloxicam provided and sent to pharmacy. CMP reivewed and kidney function WNL.  PF brace dispensed.  Follow-up 6 weeks or sooner if any problems arise. In the meantime, encouraged to call the office with any questions, concerns, change in symptoms.      Louann Sjogren, DPM

## 2023-09-19 ENCOUNTER — Telehealth: Payer: Self-pay | Admitting: Podiatry

## 2023-09-19 ENCOUNTER — Encounter: Payer: Self-pay | Admitting: Podiatry

## 2023-09-19 NOTE — Telephone Encounter (Signed)
Patient called stating she took the medication prescribed to her around 6pm on 09/18/23 (meloxicam (MOBIC) 15 MG table ) and upon waking up this morning noticed her face had become really swollen. Please call and advise this patient on some alternatives.   Thanks

## 2023-10-30 ENCOUNTER — Ambulatory Visit: Payer: 59 | Admitting: Podiatry

## 2023-11-08 ENCOUNTER — Ambulatory Visit (INDEPENDENT_AMBULATORY_CARE_PROVIDER_SITE_OTHER): Payer: 59 | Admitting: Nurse Practitioner

## 2023-11-08 ENCOUNTER — Encounter: Payer: Self-pay | Admitting: Nurse Practitioner

## 2023-11-08 VITALS — BP 130/82 | HR 84 | Wt 166.6 lb

## 2023-11-08 DIAGNOSIS — H6090 Unspecified otitis externa, unspecified ear: Secondary | ICD-10-CM | POA: Insufficient documentation

## 2023-11-08 DIAGNOSIS — H60391 Other infective otitis externa, right ear: Secondary | ICD-10-CM

## 2023-11-08 DIAGNOSIS — H66001 Acute suppurative otitis media without spontaneous rupture of ear drum, right ear: Secondary | ICD-10-CM | POA: Diagnosis not present

## 2023-11-08 MED ORDER — CIPROFLOXACIN-DEXAMETHASONE 0.3-0.1 % OT SUSP: 4.0000 [drp] | Freq: Two times a day (BID) | OTIC | 0 refills | Status: DC

## 2023-11-08 MED ORDER — AMOXICILLIN-POT CLAVULANATE 875-125 MG PO TABS: 1.0000 | ORAL_TABLET | Freq: Two times a day (BID) | ORAL | 0 refills | Status: DC

## 2023-11-08 NOTE — Assessment & Plan Note (Signed)
Presents with right ear pain, blockage, and tinnitus since Monday. 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 Examination reveals infection in the ear canal and behind the eardrum, with significant swelling and fluid behind the eardrum. Left ear shows signs of potential infection. Denies fever, chills, or sore throat but reports postnasal drip. Likely started as swimmer's ear and progressed due to opportunistic bacteria. Discussed risks of untreated infection leading to prolonged symptoms and potential spread to the left ear. Benefits of treatment include rapid symptom relief and prevention of further complications. Alternatives include watchful waiting, but this may result in prolonged discomfort and potential worsening. - Prescribe Augmentin 875 mg twice a day for 5 days, to be taken with food. - Prescribe ear drops (3-4 drops twice a day for 5 days). - Advise to use ear drops in the left ear if symptoms develop. - Recommend using ear plugs when swimming and avoiding submerging the head in water until the infection resolves. - Suggest using rubbing alcohol drops after swimming to prevent future infections. - Advise to contact the clinic if symptoms worsen or if she develops a yeast infection.

## 2023-11-08 NOTE — Assessment & Plan Note (Signed)
See otitis media for full A&P

## 2023-11-08 NOTE — Progress Notes (Signed)
Tollie Eth, DNP, AGNP-c [facility] [address] [place], [place] [address] [phone]   ACUTE VISIT- ESTABLISHED PATIENT  Blood pressure 130/82, pulse 84, weight 166 lb 9.6 oz (75.6 kg).  Subjective:  HPI Laura Henderson is a 48 y.o. female presents to day for evaluation of acute concern(s).   History of Present Illness The patient presents with right ear pain and blockage.  She has experienced a blocked sensation in her right ear since Monday, which progressed to a high-pitched ringing by Wednesday. The ear feels tender when pressed from the outside, but she rates the pain as one out of ten. The most bothersome symptom is the noise when lying on her pillow at night, which she likens to an 'electric toothbrush inside my head'.  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 She has never experienced similar symptoms before.  She mentions her daughter has had some congestion recently, and she has a slight postnasal drip but feels otherwise well. No fever, chills, or sore throat.  ROS negative except for what is listed in HPI. History, Medications, Surgery, SDOH, and Family History reviewed and updated as appropriate.  Objective:  Physical Exam Vitals and nursing note reviewed.  Constitutional:      General: She is not in acute distress.    Appearance: Normal appearance. She is not ill-appearing.  HENT:     Head: Normocephalic.     Right Ear: Hearing normal. Drainage, swelling and tenderness present. A middle ear effusion is present. Tympanic membrane is bulging.     Left Ear: Hearing normal. A middle ear effusion is present. Tympanic membrane is bulging.     Ears:     Comments: Clear effusion on the left, purulent effusion on the right    Nose: Mucosal edema and congestion present.     Mouth/Throat:     Mouth: Mucous membranes are moist.     Pharynx: Uvula midline. Posterior  oropharyngeal erythema and postnasal drip present. No oropharyngeal exudate or uvula swelling.  Neurological:     Mental Status: She is alert.         Assessment & Plan:   Problem List Items Addressed This Visit     Otitis externa   See otitis media for full A&P      Relevant Medications   amoxicillin-clavulanate (AUGMENTIN) 875-125 MG tablet   ciprofloxacin-dexamethasone (CIPRODEX) OTIC suspension   Non-recurrent acute suppurative otitis media of right ear without spontaneous rupture of tympanic membrane - Primary   Presents with right ear pain, blockage, and tinnitus since Monday. 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 Examination reveals infection in the ear canal and behind the eardrum, with significant swelling and fluid behind the eardrum. Left ear shows signs of potential infection. Denies fever, chills, or sore throat but reports postnasal drip. Likely started as swimmer's ear and progressed due to opportunistic bacteria. Discussed risks of untreated infection leading to prolonged symptoms and potential spread to the left ear. Benefits of treatment include rapid symptom relief and prevention of further complications. Alternatives include watchful waiting, but this may result in prolonged discomfort and potential worsening. - Prescribe Augmentin 875 mg twice a day for 5 days, to be taken with food. - Prescribe ear drops (3-4 drops twice a day for 5 days). - Advise to use ear drops in the left ear if symptoms develop. - Recommend using ear plugs when swimming and avoiding submerging the  head in water until the infection resolves. - Suggest using rubbing alcohol drops after swimming to prevent future infections. - Advise to contact the clinic if symptoms worsen or if she develops a yeast infection.      Relevant Medications   amoxicillin-clavulanate (AUGMENTIN) 875-125 MG tablet   ciprofloxacin-dexamethasone (CIPRODEX) OTIC suspension    General Health  Maintenance  - Advise to contact the clinic if she experiences symptoms of a yeast infection during antibiotic treatment so we can send diflucan. No second appt needed for that.   Tollie Eth, DNP, AGNP-c

## 2023-11-08 NOTE — Patient Instructions (Addendum)
Otitis Externa  Otitis externa is an infection of the outer ear canal. The outer ear canal is the area between the outside of the ear and the eardrum. Otitis externa is sometimes called swimmer's ear. What are the causes? Common causes of this condition include: Swimming in dirty water. Moisture in the ear. An injury to the inside of the ear. An object stuck in the ear. A cut or scrape on the outside of the ear or in the ear canal. What increases the risk? You are more likely to develop this condition if you go swimming often. What are the signs or symptoms? The first symptom of this condition is often itching in the ear. Later symptoms of the condition include: Swelling of the ear. Redness in the ear. Ear pain. The pain may get worse when you pull on your ear. Pus coming from the ear. How is this diagnosed? This condition may be diagnosed by examining the ear and testing fluid from the ear for bacteria and funguses. How is this treated? This condition may be treated with: Antibiotic ear drops. These are often given for 10-14 days. Medicines to reduce itching and swelling. Follow these instructions at home: If you were prescribed antibiotic ear drops, use them as told by your health care provider. Do not stop using the antibiotic even if you start to feel better. Take over-the-counter and prescription medicines only as told by your health care provider. Avoid getting water in your ears as told by your health care provider. This may include avoiding swimming or water sports for a few days. Keep all follow-up visits. This is important. How is this prevented? Keep your ears dry. Use the corner of a towel to dry your ears after you swim or bathe. Avoid scratching or putting things in your ear. Doing these things can damage the ear canal or remove the protective wax that lines it, which makes it easier for bacteria and funguses to grow. Avoid swimming in lakes, polluted water, or swimming  pools that may not have enough chlorine. Contact a health care provider if: You have a fever. Your ear is still red, swollen, painful, or draining pus after 3 days. Your redness, swelling, or pain gets worse. You have a severe headache. Get help right away if: You have redness, swelling, and pain or tenderness in the area behind your ear. Summary Otitis externa is an infection of the outer ear canal. Common causes include swimming in dirty water, moisture in the ear, or a cut or scrape in the ear. Symptoms include pain, redness, and swelling of the ear canal. If you were prescribed antibiotic ear drops, use them as told by your health care provider. Do not stop using the antibiotic even if you start to feel better. This information is not intended to replace advice given to you by your health care provider. Make sure you discuss any questions you have with your health care provider. Document Revised: 12/07/2020 Document Reviewed: 12/07/2020 Elsevier Patient Education  2024 [employer]  

## 2023-11-13 ENCOUNTER — Ambulatory Visit: Payer: 59 | Admitting: Podiatry

## 2023-11-21 DIAGNOSIS — H6993 Unspecified Eustachian tube disorder, bilateral: Secondary | ICD-10-CM | POA: Diagnosis not present

## 2023-11-21 DIAGNOSIS — B9689 Other specified bacterial agents as the cause of diseases classified elsewhere: Secondary | ICD-10-CM | POA: Diagnosis not present

## 2023-11-21 DIAGNOSIS — J019 Acute sinusitis, unspecified: Secondary | ICD-10-CM | POA: Diagnosis not present

## 2023-11-21 DIAGNOSIS — R0981 Nasal congestion: Secondary | ICD-10-CM | POA: Diagnosis not present

## 2024-02-04 DIAGNOSIS — D2262 Melanocytic nevi of left upper limb, including shoulder: Secondary | ICD-10-CM | POA: Diagnosis not present

## 2024-02-04 DIAGNOSIS — L814 Other melanin hyperpigmentation: Secondary | ICD-10-CM | POA: Diagnosis not present

## 2024-02-04 DIAGNOSIS — L723 Sebaceous cyst: Secondary | ICD-10-CM | POA: Diagnosis not present

## 2024-02-04 DIAGNOSIS — D2261 Melanocytic nevi of right upper limb, including shoulder: Secondary | ICD-10-CM | POA: Diagnosis not present

## 2024-02-07 DIAGNOSIS — Z30433 Encounter for removal and reinsertion of intrauterine contraceptive device: Secondary | ICD-10-CM | POA: Diagnosis not present

## 2024-03-17 DIAGNOSIS — L723 Sebaceous cyst: Secondary | ICD-10-CM | POA: Diagnosis not present

## 2024-04-06 DIAGNOSIS — Z30431 Encounter for routine checking of intrauterine contraceptive device: Secondary | ICD-10-CM | POA: Diagnosis not present

## 2024-04-08 ENCOUNTER — Other Ambulatory Visit: Payer: Self-pay | Admitting: Obstetrics and Gynecology

## 2024-04-08 DIAGNOSIS — Z1231 Encounter for screening mammogram for malignant neoplasm of breast: Secondary | ICD-10-CM

## 2024-05-15 NOTE — Patient Instructions (Signed)
  HEALTH MAINTENANCE RECOMMENDATIONS:  It is recommended that you get at least 30 minutes of aerobic exercise at least 5 days/week (for weight loss, you may need as much as 60-90 minutes). This can be any activity that gets your heart rate up. This can be divided in 10-15 minute intervals if needed, but try and build up your endurance at least once a week.  Weight bearing exercise is also recommended twice weekly.  Eat a healthy diet with lots of vegetables, fruits and fiber.  Colorful foods have a lot of vitamins (ie green vegetables, tomatoes, red peppers, etc).  Limit sweet tea, regular sodas and alcoholic beverages, all of which has a lot of calories and sugar.  Up to 1 alcoholic drink daily may be beneficial for women (unless trying to lose weight, watch sugars).  Drink a lot of water.  Calcium recommendations are 1200-1500 mg daily (1500 mg for postmenopausal women or women without ovaries), and vitamin D  1000 IU daily.  This should be obtained from diet and/or supplements (vitamins), and calcium should not be taken all at once, but in divided doses.  Monthly self breast exams and yearly mammograms for women over the age of 72 is recommended.  Sunscreen of at least SPF 30 should be used on all sun-exposed parts of the skin when outside between the hours of 10 am and 4 pm (not just when at beach or pool, but even with exercise, golf, tennis, and yard work!)  Use a sunscreen that says broad spectrum so it covers both UVA and UVB rays, and make sure to reapply every 1-2 hours.  Remember to change the batteries in your smoke detectors when changing your clock times in the spring and fall. Carbon monoxide detectors are recommended for your home.  Use your seat belt every time you are in a car, and please drive safely and not be distracted with cell phones and texting while driving.  Yearly flu shots are recommended in the fall. Consider getting updated COVID vaccine when available in the  Fall.

## 2024-05-15 NOTE — Progress Notes (Signed)
 Chief Complaint  Patient presents with   Annual Exam    Fasting- no changes in diet, also no skin, nail, hair changes   Laura Henderson is a 48 y.o. female who presents for a complete physical.   She sees Dr. Latisha for GYN care, has upcoming appt. Last seen 06/2023, no notes received.  Hemorrhoids:  First noted related to childbirth. Periodically flares. She is complaining of an inflamed hemorrhoid, which popped up 2 days ago.  This time it felt different, hard.  It was a little uncomfortable, not terribly painful. She did a sitz bath, it has softened, not worsening.  Last flare was after the prep from her colonoscopy, was mild, resolved after a day or so.  Preparation H is usually helpful. She denies constipation, straining, diarrhea, bleeding.  She saw SaraBeth in January 2025 with an ear infection, treated with Augmentin  and ciprodex  drops (started as OE, swim instructor).   She went to UC 2 weeks later with persistent ear fullness.  Noted to have effusion, no active infection.  Decongestant was recommended.  Rx for levaquin was sent for her to start if she had worsening sinus pain.  She never needed the levaquin. She currently denies any ear issues..  She saw the podiatrist in 09/2023 with R plantar fasciitis.  She developed facial swelling with 1 dose of meloxicam . She also noted some swelling in her legs.  Switched to ibuprofen, which she tolerates fine, and given a PF brace. This has resolved.  She has h/o of issues with dry eyes. This has improved, and is back to wearing contacts.  Seasonal allergies:  controlled with allegra and flonase when needed in the Spring. Previously had eye pressures monitored for a while, were borderline. Normal when not on the steroid spray. Not currently having any congestion or symptoms.  Colon polyp:  SSP found on colonoscopy 07/2023.  3 year f/u recommended. She denies any changes in bowel habits.  Vitamin D  deficiency: Last level was normal in 05/2023  at 33.3 when taking 1000 IU of D3 daily. She is currently taking 1000 IU, but admits to taking it much more regularly than last year (where she had missed doses).  Component Ref Range & Units (hover) 1 yr ago (05/13/23) 5 yr ago (08/28/18) 6 yr ago (08/02/17) 11 yr ago (01/21/13) 11 yr ago (10/23/12)  Vit D, 25-Hydroxy 33.3 26.3 Low  CM 28 Low  R, CM 34 R, CM 24 Low  R, CM     Immunization History  Administered Date(s) Administered   Influenza Inj Mdck Quad Pf 07/30/2019   Influenza Split 07/29/2012   Influenza,inj,Quad PF,6+ Mos 07/27/2013, 08/01/2017, 06/22/2021   Influenza-Unspecified 07/26/2018, 08/22/2020   Moderna SARS-COV2 Booster Vaccination 09/06/2020   Moderna Sars-Covid-2 Vaccination 12/17/2019, 01/14/2020   Td 08/01/2017   Tdap 05/21/2005   Didn't get a flu shot this past year Last Pap smear: 05/2021, normal, no high risk HPV. Has upcoming GYN appointment. Last mammogram:07/2023, scheduled for 07/2024 Last colonoscopy: 07/2023 sessile serrated adenoma/polyp. 3 yr f/u recommended (Dr. Kristie) prior was 06/2018-- hyperplastic/adenomatous polpys;duodenal bx-lymphocytosis  Last DEXA: never Dentist: twice yearly Ophtho: yearly  Exercise: [place] for cardio 2x/week x 30 minutes. Walks the dog x 45 minutes (briskly) 5 days/week. Uses weighted vest 2x/week. Weights 3 days/week (at home--kettlebells, handweights). swims 2-3 days/week (during the school year), none over the summer Occasional yoga.  Other Doctors: Derm--Dr. Amy [place] GYN: Dr. Latisha GI: Dr. Kristie Ophtho: Dr[facility] Kennyth at [facility]  Gluten-free diet. Limited  fried foods. Red meat once a week. Occ eggs (once or twice a week), some cheese on salads, tacos. Sweets are my nemesis--ice cream, dark chocolate peanut butter cups. Keeps portions small Drinks mostly water, coffee, some wine. Milk in cereal and oatmeal, whole milk. Not daily (1 cup/week). Almond milk in smoothies 2x/week Yogurt 3x/week. Eats kale  2x/week   Lipid screen: Lab Results  Component Value Date   CHOL 175 05/13/2023   HDL 74 05/13/2023   LDLCALC 88 05/13/2023   TRIG 67 05/13/2023   CHOLHDL 2.4 05/13/2023   Donates blood regularly, last was about 3 months ago.   PMH, PSH, SH and FH were reviewed and updated  Outpatient Encounter Medications as of 05/18/2024  Medication Sig Note   cholecalciferol (VITAMIN D ) 1000 units tablet Take 1,000 Units by mouth daily.    levonorgestrel  (MIRENA ) 20 MCG/24HR IUD 1 each by Intrauterine route once.    Magnesium 100 MG TABS Take by mouth.    Omega-3 Fatty Acids (FISH OIL) 300 MG CAPS Take by mouth.    [DISCONTINUED] amoxicillin -clavulanate (AUGMENTIN ) 875-125 MG tablet Take 1 tablet by mouth 2 (two) times daily. Take 1 tab every 12 hours. Take with food to avoid stomach upset. For infection.    [DISCONTINUED] ciprofloxacin -dexamethasone  (CIPRODEX ) OTIC suspension Place 4 drops into the right ear 2 (two) times daily.    [DISCONTINUED] Probiotic Product (PROBIOTIC DAILY PO) Take 1 capsule by mouth daily. 05/13/2023: 2-3 times a week   No facility-administered encounter medications on file as of 05/18/2024.   Allergies  Allergen Reactions   Bee Venom Anaphylaxis   Sulfa Drugs Cross Reactors Hives    ROS:  The patient denies anorexia, fever, weight changes, headaches, vision changes, decreased hearing, ear pain, sore throat, breast concerns, chest pain, palpitations, dizziness, syncope, dyspnea on exertion, cough, swelling, nausea, vomiting, diarrhea, constipation, abdominal pain, melena, hematochezia, indigestion/heartburn, hematuria, incontinence, dysuria, vaginal discharge, odor or itch, genital lesions, joint pains, numbness, tingling, weakness, tremor, suspicious skin lesions, depression, anxiety, abnormal bleeding/bruising, or enlarged lymph nodes. Allergies are not flaring Dry eyes are improved. Has IUD. No longer has any bleeding. No night sweats, rare hot flash. Sees  dermatologist regularly Recent hemorrhoid flare per HPI   PHYSICAL EXAM:  BP 120/68   Pulse 78   Ht 5' 8 (1.727 m)   Wt 161 lb 9.6 oz (73.3 kg)   SpO2 98%   BMI 24.57 kg/m   Wt Readings from Last 3 Encounters:  05/18/24 161 lb 9.6 oz (73.3 kg)  11/08/23 166 lb 9.6 oz (75.6 kg)  05/13/23 159 lb 12.8 oz (72.5 kg)    General Appearance:    Alert, cooperative, no distress, appears stated age  Head:    Normocephalic, without obvious abnormality, atraumatic  Eyes:    PERRL, conjunctiva/corneas clear, EOM's intact, fundi    benign  Ears:    Normal TM's and external ear canals  Nose:   Nares normal, mucosa normal, no drainage or sinus   tenderness  Throat:   Lips, mucosa, and tongue normal; teeth and gums normal  Neck:   Supple, no lymphadenopathy;  thyroid:  no enlargement/ tenderness/nodules; no carotid bruit or JVD  Back:    Spine nontender, no curvature, ROM normal, no CVA     tenderness  Lungs:     Clear to auscultation bilaterally without wheezes, rales or     ronchi; respirations unlabored  Chest Wall:    No tenderness or deformity   Heart:  Regular rate and rhythm, S1 and S2 normal, no murmur, rub   or gallop  Breast Exam:    Deferred to GYN  Abdomen:     Soft, non-tender, nondistended, normoactive bowel sounds,    no masses, no hepatosplenomegaly  Genitalia:    Deferred to GYN  Rectal:  Mildly inflamed hemorrhoid noted posteriorly.  Nontender. Some smaller tags also noted  Extremities:   No clubbing, cyanosis or edema  Pulses:   2+ and symmetric all extremities  Skin:   Skin color, texture, turgor normal, no rashes or lesions, Limited skin exam, not changed into gown.  Lymph nodes:   Cervical, supraclavicular nodes normal  Neurologic:   CNII-XII intact, normal strength, sensation and gait; reflexes 2+ and symmetric throughout          Psych:   Normal mood, affect, hygiene and grooming.       05/18/2024    8:29 AM 05/13/2023   10:21 AM 03/11/2023   11:13 AM 11/21/2020    11:48 AM 05/12/2018    9:30 AM  Depression screen PHQ 2/9  Decreased Interest 0 0 0 0 0  Down, Depressed, Hopeless 0 0 0 0 0  PHQ - 2 Score 0 0 0 0 0     ASSESSMENT/PLAN:  Annual physical exam - Plan: Vitamin D , 25-hydroxy  Vitamin D  deficiency - recheck today. If still on the low side, increase to 2000 IU daily (vs just taking extra in the colder months and cont 1000 IU) discussed - Plan: Vitamin D , 25-hydroxy  External hemorrhoid - reassured--may have had very small thrombosis that is resolving.  Cont OTC prn (discussed Anusol HC vs Prep H), sitz prn  Inadequate calcium intake.  Discussed dietary measures in detail, vs supplements to make up the difference. She will try drinking more almond milk. Discussed whole milk--generally not recommended; her weight and lipids are good, okay to continue if that is her preference for certain meals.   Discussed monthly self breast exams and yearly mammograms; at least 30 minutes of aerobic activity at least 5 days/week, weight-bearing exercise at least 2x/week; proper sunscreen use reviewed; healthy diet, including goals of calcium and vitamin D  intake and alcohol recommendations (less than or equal to 1 drink/day) reviewed; regular seatbelt use; changing batteries in smoke detectors.  Immunization recommendations discussed--  yearly flu shots are recommended.   Colonoscopy recommendations reviewed, due in 07/2026  F/u 1 year, sooner prn.

## 2024-05-18 ENCOUNTER — Ambulatory Visit: Payer: 59 | Admitting: Family Medicine

## 2024-05-18 ENCOUNTER — Encounter: Payer: Self-pay | Admitting: Family Medicine

## 2024-05-18 VITALS — BP 120/68 | HR 78 | Ht 68.0 in | Wt 161.6 lb

## 2024-05-18 DIAGNOSIS — E559 Vitamin D deficiency, unspecified: Secondary | ICD-10-CM

## 2024-05-18 DIAGNOSIS — K644 Residual hemorrhoidal skin tags: Secondary | ICD-10-CM | POA: Diagnosis not present

## 2024-05-18 DIAGNOSIS — Z0000 Encounter for general adult medical examination without abnormal findings: Secondary | ICD-10-CM

## 2024-05-19 ENCOUNTER — Ambulatory Visit: Payer: Self-pay | Admitting: Family Medicine

## 2024-05-19 LAB — VITAMIN D 25 HYDROXY (VIT D DEFICIENCY, FRACTURES): Vit D, 25-Hydroxy: 35.6 ng/mL (ref 30.0–100.0)

## 2024-06-09 DIAGNOSIS — D485 Neoplasm of uncertain behavior of skin: Secondary | ICD-10-CM | POA: Diagnosis not present

## 2024-06-09 DIAGNOSIS — D224 Melanocytic nevi of scalp and neck: Secondary | ICD-10-CM | POA: Diagnosis not present

## 2024-06-09 DIAGNOSIS — L723 Sebaceous cyst: Secondary | ICD-10-CM | POA: Diagnosis not present

## 2024-06-09 DIAGNOSIS — D2261 Melanocytic nevi of right upper limb, including shoulder: Secondary | ICD-10-CM | POA: Diagnosis not present

## 2024-06-09 DIAGNOSIS — D2262 Melanocytic nevi of left upper limb, including shoulder: Secondary | ICD-10-CM | POA: Diagnosis not present

## 2024-06-09 DIAGNOSIS — D225 Melanocytic nevi of trunk: Secondary | ICD-10-CM | POA: Diagnosis not present

## 2024-07-09 DIAGNOSIS — Z124 Encounter for screening for malignant neoplasm of cervix: Secondary | ICD-10-CM | POA: Diagnosis not present

## 2024-07-09 DIAGNOSIS — Z1151 Encounter for screening for human papillomavirus (HPV): Secondary | ICD-10-CM | POA: Diagnosis not present

## 2024-07-09 DIAGNOSIS — Z6824 Body mass index (BMI) 24.0-24.9, adult: Secondary | ICD-10-CM | POA: Diagnosis not present

## 2024-07-09 DIAGNOSIS — Z01419 Encounter for gynecological examination (general) (routine) without abnormal findings: Secondary | ICD-10-CM | POA: Diagnosis not present

## 2024-07-10 ENCOUNTER — Ambulatory Visit
Admission: RE | Admit: 2024-07-10 | Discharge: 2024-07-10 | Disposition: A | Discharge status: Home / Self Care | Source: Ambulatory Visit | Attending: Obstetrics and Gynecology | Admitting: Obstetrics and Gynecology

## 2024-07-10 DIAGNOSIS — Z1231 Encounter for screening mammogram for malignant neoplasm of breast: Secondary | ICD-10-CM

## 2024-07-15 ENCOUNTER — Other Ambulatory Visit: Payer: Self-pay | Admitting: Obstetrics and Gynecology

## 2024-07-15 DIAGNOSIS — R928 Other abnormal and inconclusive findings on diagnostic imaging of breast: Secondary | ICD-10-CM

## 2024-07-22 ENCOUNTER — Ambulatory Visit
Admission: RE | Admit: 2024-07-22 | Discharge: 2024-07-22 | Disposition: A | Discharge status: Home / Self Care | Source: Ambulatory Visit | Attending: Obstetrics and Gynecology | Admitting: Obstetrics and Gynecology

## 2024-07-22 DIAGNOSIS — R928 Other abnormal and inconclusive findings on diagnostic imaging of breast: Secondary | ICD-10-CM

## 2024-07-22 DIAGNOSIS — N6002 Solitary cyst of left breast: Secondary | ICD-10-CM | POA: Diagnosis not present

## 2024-08-21 DIAGNOSIS — N760 Acute vaginitis: Secondary | ICD-10-CM | POA: Diagnosis not present

## 2024-09-08 DIAGNOSIS — D485 Neoplasm of uncertain behavior of skin: Secondary | ICD-10-CM | POA: Diagnosis not present

## 2024-09-22 DIAGNOSIS — Z30431 Encounter for routine checking of intrauterine contraceptive device: Secondary | ICD-10-CM | POA: Diagnosis not present

## 2024-09-22 DIAGNOSIS — N939 Abnormal uterine and vaginal bleeding, unspecified: Secondary | ICD-10-CM | POA: Diagnosis not present

## 2025-06-16 ENCOUNTER — Encounter: Payer: Self-pay | Admitting: Family Medicine
